# Patient Record
Sex: Male | Born: 1970 | Race: White | Hispanic: No | Marital: Married | Smoking: Never smoker
Health system: Southern US, Community
[De-identification: ages and names within clinical notes are randomized; demographics above are authoritative.]

## PROBLEM LIST (undated history)

## (undated) DIAGNOSIS — M1990 Unspecified osteoarthritis, unspecified site: Secondary | ICD-10-CM

## (undated) DIAGNOSIS — K219 Gastro-esophageal reflux disease without esophagitis: Secondary | ICD-10-CM

---

## 1998-10-27 ENCOUNTER — Encounter: Payer: Self-pay | Admitting: Family Medicine

## 1998-10-27 ENCOUNTER — Ambulatory Visit (HOSPITAL_COMMUNITY): Admission: RE | Admit: 1998-10-27 | Discharge: 1998-10-27 | Payer: Self-pay | Admitting: Family Medicine

## 2007-03-06 ENCOUNTER — Emergency Department (HOSPITAL_COMMUNITY): Admission: EM | Admit: 2007-03-06 | Discharge: 2007-03-06 | Payer: Self-pay | Admitting: Emergency Medicine

## 2008-11-02 IMAGING — CR DG LUMBAR SPINE COMPLETE 4+V
3 series · 8 of 10 positions shown · non-contrast
Comparison: NONE

CLINICAL DATA: Chronic pain. No history of trauma. Evaluate for 
arthritis.  

LUMBAR SPINE

[Series 1: view not recorded · 0.17mm/px · 3 of 4 slices shown (1 of 3)]
[im 1/4]
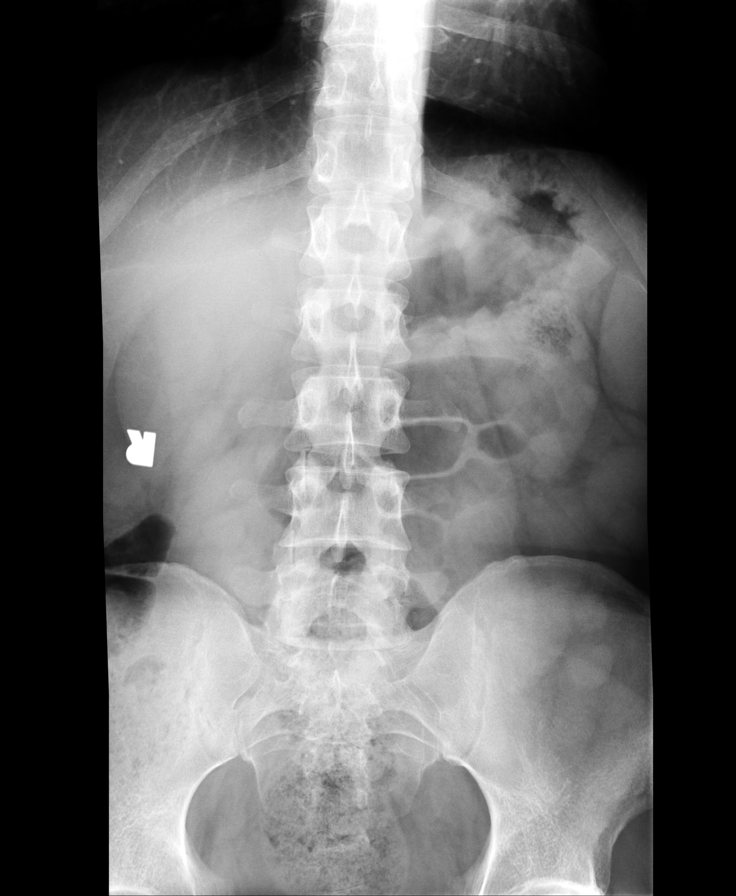
[im 2/4]
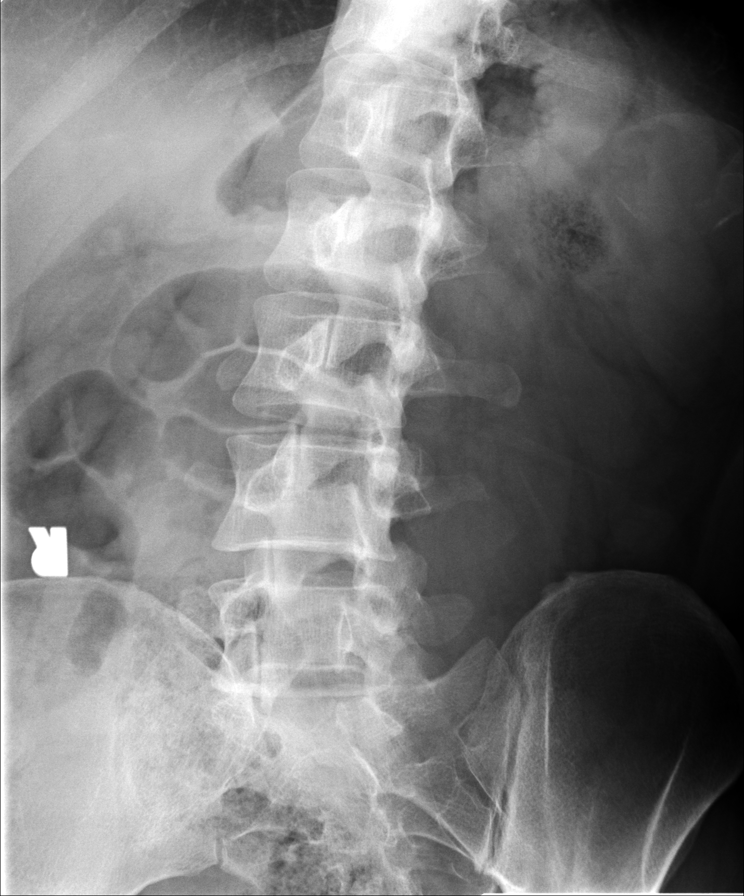
[im 4/4]
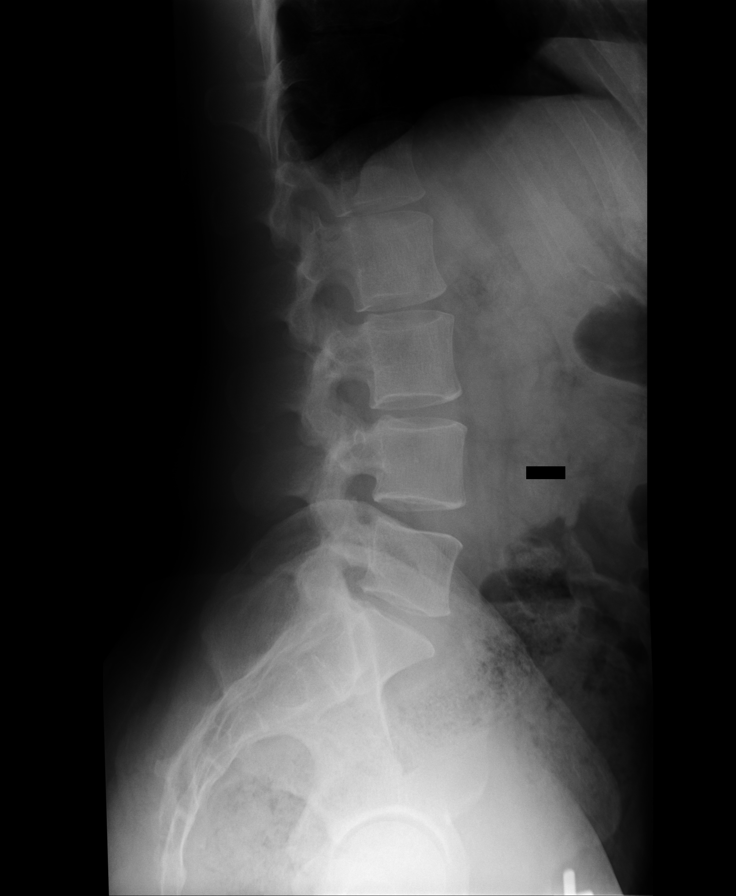

[view not recorded (2 of 3)]
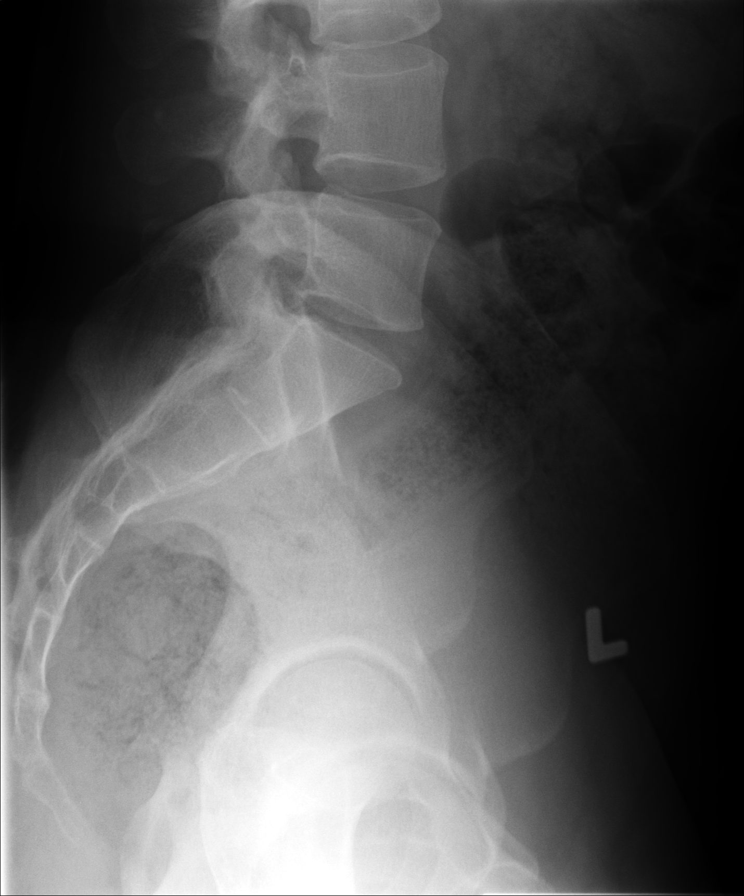

[Series 3: view not recorded · 0.17mm/px · 4 of 8 slices shown (3 of 3)]
[im 1/8]
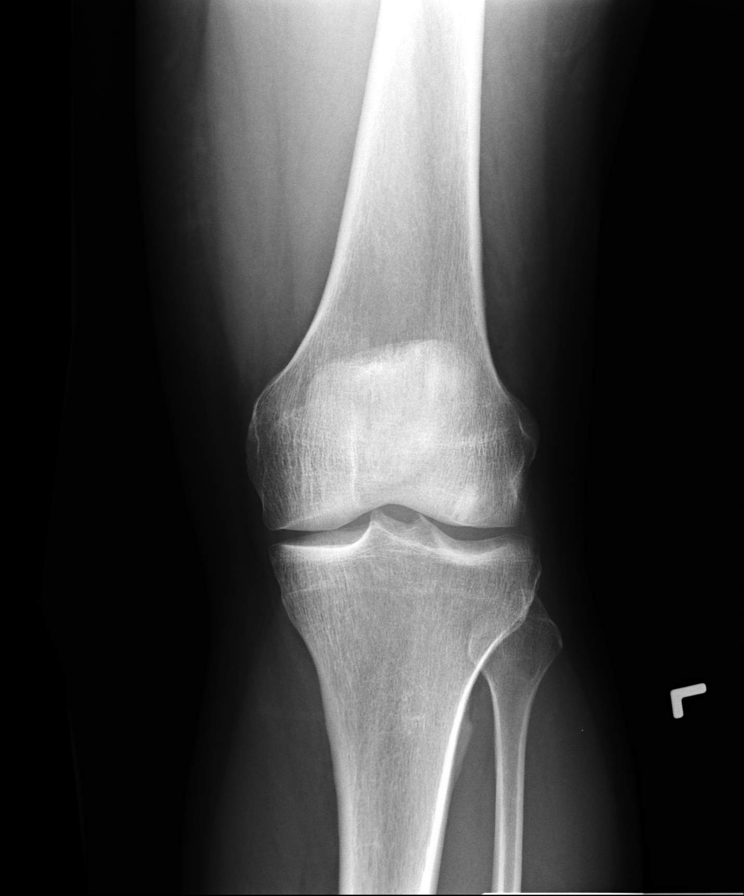
[im 2/8]
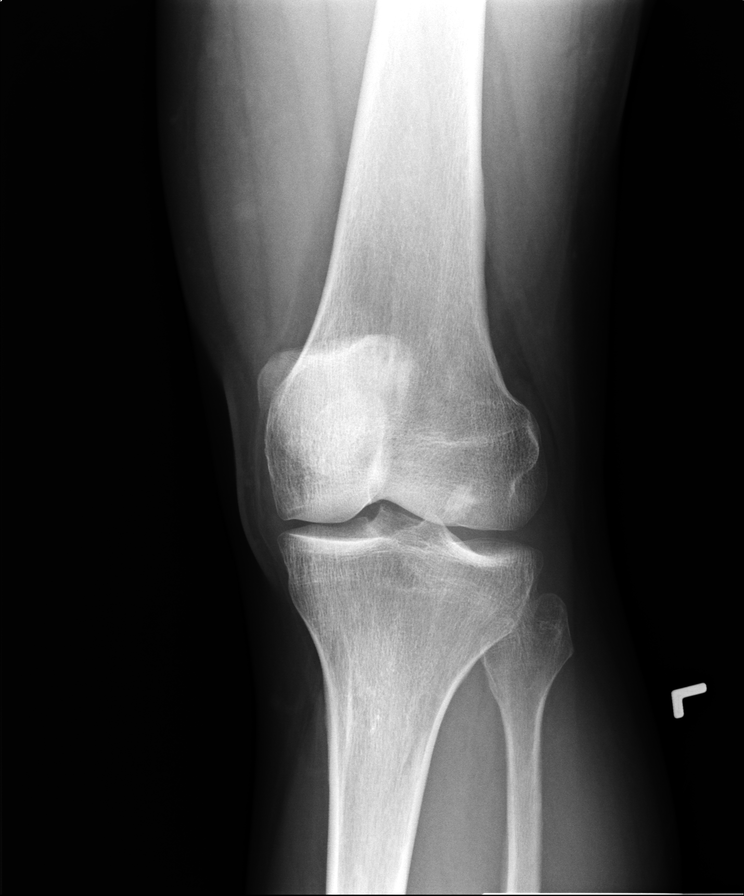
[im 3/8]
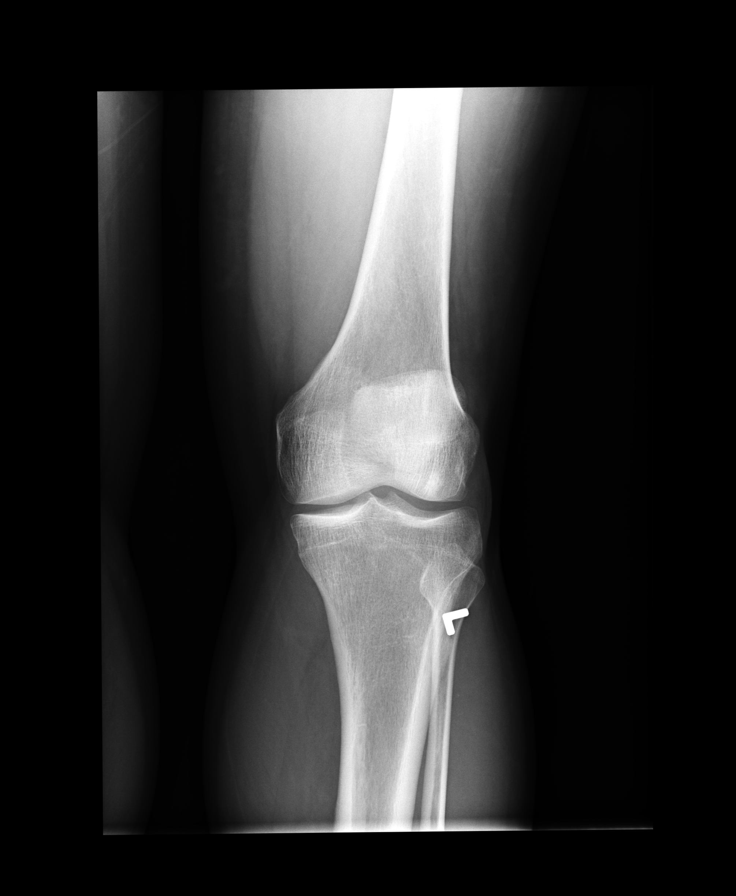
[im 5/8]
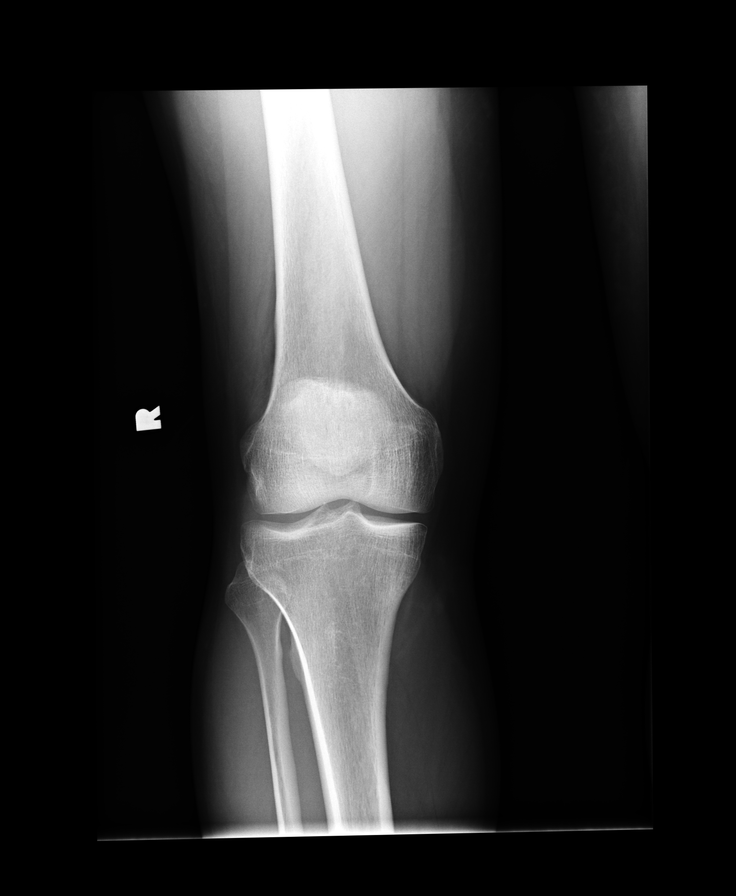

[8 of 10 positions shown; findings below may reference images not displayed]

FINDINGS: A report from 11/13/05 is submitted. There is minimal 
disc space narrowing again noted at L5-S1 with minimal marginal 
spur formation. No new findings are felt to be present. The 
vertebral bodies are intact as are the remaining disc spaces and 
posterior elements. The SI joints have a normal appearance. There 
is no evidence for ankylosing spondylitis.
IMPRESSION: Minimal degenerative change of the lower lumbar

## 2011-02-05 LAB — CBC
HCT: 45.4
Hemoglobin: 15.8
MCHC: 34.8
MCV: 89.5
Platelets: 222
RBC: 5.07
RDW: 13.1
WBC: 8.2

## 2011-02-05 LAB — URINALYSIS, ROUTINE W REFLEX MICROSCOPIC
Bilirubin Urine: NEGATIVE
Glucose, UA: NEGATIVE
Hgb urine dipstick: NEGATIVE
Ketones, ur: NEGATIVE
Nitrite: NEGATIVE
Protein, ur: NEGATIVE
Specific Gravity, Urine: 1.006
Urobilinogen, UA: 0.2
pH: 7

## 2011-02-05 LAB — COMPREHENSIVE METABOLIC PANEL
ALT: 47
AST: 40 — ABNORMAL HIGH
Albumin: 4.2
Alkaline Phosphatase: 71
BUN: 22
CO2: 27
Calcium: 9.4
Chloride: 105
Creatinine, Ser: 1.05
GFR calc Af Amer: >60
GFR calc non Af Amer: >60
Glucose, Bld: 132 — ABNORMAL HIGH
Potassium: 4
Sodium: 139
Total Bilirubin: 0.8
Total Protein: 6.8

## 2011-02-05 LAB — DIFFERENTIAL
Basophils Absolute: 0.1
Basophils Relative: 1
Eosinophils Absolute: 0.2
Eosinophils Relative: 2
Lymphocytes Relative: 20
Lymphs Abs: 1.6
Monocytes Absolute: 0.3
Monocytes Relative: 4
Neutro Abs: 6
Neutrophils Relative %: 73

## 2011-02-05 LAB — LIPASE, BLOOD: Lipase: 42

## 2011-02-05 LAB — D-DIMER, QUANTITATIVE: D-Dimer, Quant: <0.22

## 2011-08-19 ENCOUNTER — Telehealth: Payer: Self-pay | Admitting: Cardiology

## 2011-08-19 NOTE — Telephone Encounter (Signed)
Close  

## 2014-08-16 ENCOUNTER — Emergency Department (HOSPITAL_COMMUNITY): Payer: BLUE CROSS/BLUE SHIELD

## 2014-08-16 ENCOUNTER — Encounter (HOSPITAL_COMMUNITY): Payer: Self-pay | Admitting: Emergency Medicine

## 2014-08-16 ENCOUNTER — Emergency Department (HOSPITAL_COMMUNITY)
Admission: EM | Admit: 2014-08-16 | Discharge: 2014-08-16 | Disposition: A | Discharge status: Home / Self Care | Payer: BLUE CROSS/BLUE SHIELD | Attending: Emergency Medicine | Admitting: Emergency Medicine

## 2014-08-16 DIAGNOSIS — Z880 Allergy status to penicillin: Secondary | ICD-10-CM | POA: Insufficient documentation

## 2014-08-16 DIAGNOSIS — K219 Gastro-esophageal reflux disease without esophagitis: Secondary | ICD-10-CM | POA: Insufficient documentation

## 2014-08-16 DIAGNOSIS — K8020 Calculus of gallbladder without cholecystitis without obstruction: Secondary | ICD-10-CM | POA: Insufficient documentation

## 2014-08-16 DIAGNOSIS — R1013 Epigastric pain: Secondary | ICD-10-CM | POA: Diagnosis present

## 2014-08-16 DIAGNOSIS — Z79899 Other long term (current) drug therapy: Secondary | ICD-10-CM | POA: Diagnosis not present

## 2014-08-16 HISTORY — DX: Gastro-esophageal reflux disease without esophagitis: K21.9

## 2014-08-16 HISTORY — DX: Unspecified osteoarthritis, unspecified site: M19.90

## 2014-08-16 LAB — COMPREHENSIVE METABOLIC PANEL
ALT: 21 U/L (ref 0–53)
AST: 21 U/L (ref 0–37)
Albumin: 4.7 g/dL (ref 3.5–5.2)
Alkaline Phosphatase: 88 U/L (ref 39–117)
Anion gap: 10 (ref 5–15)
BUN: 15 mg/dL (ref 6–23)
CO2: 22 mmol/L (ref 19–32)
Calcium: 9 mg/dL (ref 8.4–10.5)
Chloride: 106 mmol/L (ref 96–112)
Creatinine, Ser: 1.02 mg/dL (ref 0.50–1.35)
GFR calc Af Amer: >90 mL/min (ref >90)
GFR calc non Af Amer: 87 mL/min — ABNORMAL LOW (ref >90)
Glucose, Bld: 107 mg/dL — ABNORMAL HIGH (ref 70–99)
Potassium: 3.4 mmol/L — ABNORMAL LOW (ref 3.5–5.1)
Sodium: 138 mmol/L (ref 135–145)
Total Bilirubin: 1.1 mg/dL (ref 0.3–1.2)
Total Protein: 7.8 g/dL (ref 6.0–8.3)

## 2014-08-16 LAB — CBC WITH DIFFERENTIAL/PLATELET
Basophils Absolute: 0 10*3/uL (ref 0.0–0.1)
Basophils Relative: 0 % (ref 0–1)
Eosinophils Absolute: 0.1 10*3/uL (ref 0.0–0.7)
Eosinophils Relative: 1 % (ref 0–5)
HCT: 47.1 % (ref 39.0–52.0)
Hemoglobin: 16.3 g/dL (ref 13.0–17.0)
Lymphocytes Relative: 18 % (ref 12–46)
Lymphs Abs: 2 10*3/uL (ref 0.7–4.0)
MCH: 30.8 pg (ref 26.0–34.0)
MCHC: 34.6 g/dL (ref 30.0–36.0)
MCV: 89 fL (ref 78.0–100.0)
Monocytes Absolute: 0.7 10*3/uL (ref 0.1–1.0)
Monocytes Relative: 6 % (ref 3–12)
Neutro Abs: 8.1 10*3/uL — ABNORMAL HIGH (ref 1.7–7.7)
Neutrophils Relative %: 75 % (ref 43–77)
Platelets: 279 10*3/uL (ref 150–400)
RBC: 5.29 MIL/uL (ref 4.22–5.81)
RDW: 13 % (ref 11.5–15.5)
WBC: 10.9 10*3/uL — ABNORMAL HIGH (ref 4.0–10.5)

## 2014-08-16 LAB — LIPASE, BLOOD: Lipase: 33 U/L (ref 11–59)

## 2014-08-16 MED ORDER — ONDANSETRON HCL 4 MG PO TABS: 4.0000 mg | ORAL_TABLET | Freq: Four times a day (QID) | ORAL | Status: DC

## 2014-08-16 MED ORDER — HYDROCODONE-ACETAMINOPHEN 5-325 MG PO TABS: 2.0000 | ORAL_TABLET | ORAL | Status: DC | PRN

## 2014-08-16 NOTE — ED Notes (Signed)
Pt admits to RUQ pain and intermittent nausea s/p eating - pt denies vomiting or fever, admits intermittent diarrhea. Pt denies any hx of gallstones.

## 2014-08-16 NOTE — ED Notes (Signed)
Pt reports for a week after meals he has epigastric pain. Pt reports he is also having right sided abdominal pains.

## 2014-08-16 NOTE — ED Provider Notes (Signed)
CSN: [id number]     Arrival date & time 08/16/14  0257 History   First MD Initiated Contact with Patient 08/16/14 [phone]     Chief Complaint  Patient presents with  . Abdominal Pain     (Consider location/radiation/quality/duration/timing/severity/associated sxs/prior Treatment) HPI Comments: Patient presents to the ER for evaluation of abdominal pain. Patient reports that for the last week or so he has been experiencing pain in the right upper and epigastric area of his abdomen. Initially the pain was with fatty foods, but in the last couple of days he has been expressing pain with any food intake. Patient has had nausea but he has not had any vomiting. No hematemesis, melena, rectal bleeding. He does have history of acid reflux and a hernia, but the symptoms feel different.  Patient is a 44 y.o. male presenting with abdominal pain.  Abdominal Pain Associated symptoms: nausea     Past Medical History  Diagnosis Date  . Acid reflux   . Arthritis    History reviewed. No pertinent past surgical history. History reviewed. No pertinent family history. History  Substance Use Topics  . Smoking status: Never Smoker   . Smokeless tobacco: Not on file  . Alcohol Use: No    Review of Systems  Gastrointestinal: Positive for nausea and abdominal pain.  All other systems reviewed and are negative.     Allergies  Penicillins  Home Medications   Prior to Admission medications   Medication Sig Start Date End Date Taking? Authorizing Provider  Glucosamine-Chondroitin 750 & 400 MG MISC Take 1 tablet by mouth 2 (two) times daily.   Yes Historical Provider, MD  minoxidil (ROGAINE) 2 % external solution Apply 1 mL topically 2 (two) times daily.   Yes Historical Provider, MD  Multiple Vitamin (MULTIVITAMIN WITH MINERALS) TABS tablet Take 1 tablet by mouth daily.   Yes Historical Provider, MD  Omega-3 Fatty Acids (FISH OIL) 1200 MG CAPS Take 1,200 mg by mouth daily.   Yes Historical Provider,  MD  omeprazole-sodium bicarbonate (ZEGERID) 40-1100 MG per capsule Take 1 capsule by mouth daily as needed (for acid reflux).   Yes Historical Provider, MD   BP 152/98 mmHg  Pulse 85  Temp(Src) 97.9 F (36.6 C) (Oral)  Resp 18  Ht  (1.88 m)  Wt 212 lb (96.163 kg)  BMI 27.21 kg/m2  SpO2 100% Physical Exam  Constitutional: He is oriented to person, place, and time. He appears well-developed and well-nourished. No distress.  HENT:  Head: Normocephalic and atraumatic.  Right Ear: Hearing normal.  Left Ear: Hearing normal.  Nose: Nose normal.  Mouth/Throat: Oropharynx is clear and moist and mucous membranes are normal.  Eyes: Conjunctivae and EOM are normal. Pupils are equal, round, and reactive to light.  Neck: Normal range of motion. Neck supple.  Cardiovascular: Regular rhythm, S1 normal and S2 normal.  Exam reveals no gallop and no friction rub.   No murmur heard. Pulmonary/Chest: Effort normal and breath sounds normal. No respiratory distress. He exhibits no tenderness.  Abdominal: Soft. Normal appearance and bowel sounds are normal. There is no hepatosplenomegaly. There is tenderness in the right upper quadrant. There is no rebound, no guarding, no tenderness at McBurney's point and negative Murphy's sign. No hernia.  Musculoskeletal: Normal range of motion.  Neurological: He is alert and oriented to person, place, and time. He has normal strength. No cranial nerve deficit or sensory deficit. Coordination normal. GCS eye subscore is 4. GCS verbal subscore is 5. GCS  motor subscore is 6.  Skin: Skin is warm, dry and intact. No rash noted. No cyanosis.  Psychiatric: He has a normal mood and affect. His speech is normal and behavior is normal. Thought content normal.  Nursing note and vitals reviewed.   ED Course  Procedures (including critical care time) Labs Review Labs Reviewed  CBC WITH DIFFERENTIAL/PLATELET - Abnormal; Notable for the following:    WBC 10.9 (*)    Neutro  Abs 8.1 (*)    All other components within normal limits  COMPREHENSIVE METABOLIC PANEL - Abnormal; Notable for the following:    Potassium 3.4 (*)    Glucose, Bld 107 (*)    GFR calc non Af Amer 87 (*)    All other components within normal limits  LIPASE, BLOOD    Imaging Review [place] Abdomen Limited  08/16/2014   CLINICAL DATA:  One day of midline and right-sided abdominal pain also postprandial nausea  EXAM: US ABDOMEN LIMITED - RIGHT UPPER QUADRANT  COMPARISON:  Abdominal ultrasound of March 06, 2007  FINDINGS: Gallbladder:  The gallbladder is adequately distended. There is an echogenic non mobile non shadowing focus adherent to the luminal wall. There is no gallbladder wall thickening, pericholecystic fluid, or positive sonographic Murphy's sign.  Common bile duct:  Diameter: 2.4 mm  Liver:  The liver exhibits normal echotexture with no focal mass or ductal dilation.  IMPRESSION: Non mobile 4 mm stone versus polyp within the otherwise normal-appearing gallbladder. The liver and common bile duct are normal.   Electronically Signed   By: David  Swazil[place]   On: 08/16/2014 07:37     EKG Interpretation None      MDM   Final diagnoses:  None   cholelithiasis  This presents to the ER for evaluation of progressively worsening right upper abdominal pain. Initially pain was with eating fatty foods, now experiencing pain with any intake of food. Patient did have moderate right upper quadrant tenderness on examination. Lab work including LFTs are normal. Ultrasound performed does reveal evidence of a gallstone, no evidence of cholecystitis. Patient is comfortable currently. Patient to be discharged, follow-up with general surgery.    Gilda Creasehristopher J Clayborne Divis, MD 08/16/14 [phone]

## 2014-08-16 NOTE — Discharge Instructions (Signed)

## 2015-08-22 DIAGNOSIS — Z1283 Encounter for screening for malignant neoplasm of skin: Secondary | ICD-10-CM | POA: Diagnosis not present

## 2015-08-22 DIAGNOSIS — Z85820 Personal history of malignant melanoma of skin: Secondary | ICD-10-CM | POA: Diagnosis not present

## 2015-08-22 DIAGNOSIS — L821 Other seborrheic keratosis: Secondary | ICD-10-CM | POA: Diagnosis not present

## 2015-08-22 DIAGNOSIS — Z08 Encounter for follow-up examination after completed treatment for malignant neoplasm: Secondary | ICD-10-CM | POA: Diagnosis not present

## 2015-09-01 DIAGNOSIS — K219 Gastro-esophageal reflux disease without esophagitis: Secondary | ICD-10-CM | POA: Diagnosis not present

## 2015-11-21 DIAGNOSIS — Z08 Encounter for follow-up examination after completed treatment for malignant neoplasm: Secondary | ICD-10-CM | POA: Diagnosis not present

## 2015-11-21 DIAGNOSIS — Z85820 Personal history of malignant melanoma of skin: Secondary | ICD-10-CM | POA: Diagnosis not present

## 2015-11-21 DIAGNOSIS — L821 Other seborrheic keratosis: Secondary | ICD-10-CM | POA: Diagnosis not present

## 2015-11-21 DIAGNOSIS — Z1283 Encounter for screening for malignant neoplasm of skin: Secondary | ICD-10-CM | POA: Diagnosis not present

## 2016-02-21 DIAGNOSIS — D225 Melanocytic nevi of trunk: Secondary | ICD-10-CM | POA: Diagnosis not present

## 2016-02-21 DIAGNOSIS — Z85820 Personal history of malignant melanoma of skin: Secondary | ICD-10-CM | POA: Diagnosis not present

## 2016-02-21 DIAGNOSIS — Z1283 Encounter for screening for malignant neoplasm of skin: Secondary | ICD-10-CM | POA: Diagnosis not present

## 2016-02-21 DIAGNOSIS — Z08 Encounter for follow-up examination after completed treatment for malignant neoplasm: Secondary | ICD-10-CM | POA: Diagnosis not present

## 2016-02-21 DIAGNOSIS — D485 Neoplasm of uncertain behavior of skin: Secondary | ICD-10-CM | POA: Diagnosis not present

## 2016-04-08 DIAGNOSIS — Z23 Encounter for immunization: Secondary | ICD-10-CM | POA: Diagnosis not present

## 2016-04-08 DIAGNOSIS — Z0000 Encounter for general adult medical examination without abnormal findings: Secondary | ICD-10-CM | POA: Diagnosis not present

## 2016-04-08 DIAGNOSIS — R030 Elevated blood-pressure reading, without diagnosis of hypertension: Secondary | ICD-10-CM | POA: Diagnosis not present

## 2016-04-08 DIAGNOSIS — R7309 Other abnormal glucose: Secondary | ICD-10-CM | POA: Diagnosis not present

## 2016-04-08 DIAGNOSIS — Z8349 Family history of other endocrine, nutritional and metabolic diseases: Secondary | ICD-10-CM | POA: Diagnosis not present

## 2016-05-22 DIAGNOSIS — Z85820 Personal history of malignant melanoma of skin: Secondary | ICD-10-CM | POA: Diagnosis not present

## 2016-05-22 DIAGNOSIS — Z1283 Encounter for screening for malignant neoplasm of skin: Secondary | ICD-10-CM | POA: Diagnosis not present

## 2016-05-22 DIAGNOSIS — Z08 Encounter for follow-up examination after completed treatment for malignant neoplasm: Secondary | ICD-10-CM | POA: Diagnosis not present

## 2016-05-22 DIAGNOSIS — D225 Melanocytic nevi of trunk: Secondary | ICD-10-CM | POA: Diagnosis not present

## 2017-04-09 DIAGNOSIS — Z0000 Encounter for general adult medical examination without abnormal findings: Secondary | ICD-10-CM | POA: Diagnosis not present

## 2017-04-09 DIAGNOSIS — R7303 Prediabetes: Secondary | ICD-10-CM | POA: Diagnosis not present

## 2017-04-09 DIAGNOSIS — M459 Ankylosing spondylitis of unspecified sites in spine: Secondary | ICD-10-CM | POA: Diagnosis not present

## 2017-04-09 DIAGNOSIS — R030 Elevated blood-pressure reading, without diagnosis of hypertension: Secondary | ICD-10-CM | POA: Diagnosis not present

## 2017-04-09 DIAGNOSIS — E7800 Pure hypercholesterolemia, unspecified: Secondary | ICD-10-CM | POA: Diagnosis not present

## 2017-04-09 DIAGNOSIS — Z125 Encounter for screening for malignant neoplasm of prostate: Secondary | ICD-10-CM | POA: Diagnosis not present

## 2017-05-12 DIAGNOSIS — L02229 Furuncle of trunk, unspecified: Secondary | ICD-10-CM | POA: Diagnosis not present

## 2017-05-12 DIAGNOSIS — Z1283 Encounter for screening for malignant neoplasm of skin: Secondary | ICD-10-CM | POA: Diagnosis not present

## 2017-05-12 DIAGNOSIS — Z85820 Personal history of malignant melanoma of skin: Secondary | ICD-10-CM | POA: Diagnosis not present

## 2017-05-12 DIAGNOSIS — Z08 Encounter for follow-up examination after completed treatment for malignant neoplasm: Secondary | ICD-10-CM | POA: Diagnosis not present

## 2017-07-21 DIAGNOSIS — R300 Dysuria: Secondary | ICD-10-CM | POA: Diagnosis not present

## 2017-08-18 DIAGNOSIS — R102 Pelvic and perineal pain: Secondary | ICD-10-CM | POA: Diagnosis not present

## 2017-11-10 DIAGNOSIS — Z85820 Personal history of malignant melanoma of skin: Secondary | ICD-10-CM | POA: Diagnosis not present

## 2017-11-10 DIAGNOSIS — L821 Other seborrheic keratosis: Secondary | ICD-10-CM | POA: Diagnosis not present

## 2017-11-10 DIAGNOSIS — Z1283 Encounter for screening for malignant neoplasm of skin: Secondary | ICD-10-CM | POA: Diagnosis not present

## 2017-11-10 DIAGNOSIS — Z08 Encounter for follow-up examination after completed treatment for malignant neoplasm: Secondary | ICD-10-CM | POA: Diagnosis not present

## 2018-04-13 DIAGNOSIS — R7303 Prediabetes: Secondary | ICD-10-CM | POA: Diagnosis not present

## 2018-04-13 DIAGNOSIS — M459 Ankylosing spondylitis of unspecified sites in spine: Secondary | ICD-10-CM | POA: Diagnosis not present

## 2018-04-13 DIAGNOSIS — Z0000 Encounter for general adult medical examination without abnormal findings: Secondary | ICD-10-CM | POA: Diagnosis not present

## 2018-04-13 DIAGNOSIS — Z125 Encounter for screening for malignant neoplasm of prostate: Secondary | ICD-10-CM | POA: Diagnosis not present

## 2018-04-13 DIAGNOSIS — E7800 Pure hypercholesterolemia, unspecified: Secondary | ICD-10-CM | POA: Diagnosis not present

## 2018-05-11 DIAGNOSIS — Z08 Encounter for follow-up examination after completed treatment for malignant neoplasm: Secondary | ICD-10-CM | POA: Diagnosis not present

## 2018-05-11 DIAGNOSIS — D485 Neoplasm of uncertain behavior of skin: Secondary | ICD-10-CM | POA: Diagnosis not present

## 2018-05-11 DIAGNOSIS — Z1283 Encounter for screening for malignant neoplasm of skin: Secondary | ICD-10-CM | POA: Diagnosis not present

## 2018-05-11 DIAGNOSIS — L821 Other seborrheic keratosis: Secondary | ICD-10-CM | POA: Diagnosis not present

## 2018-05-11 DIAGNOSIS — D225 Melanocytic nevi of trunk: Secondary | ICD-10-CM | POA: Diagnosis not present

## 2018-05-11 DIAGNOSIS — D2261 Melanocytic nevi of right upper limb, including shoulder: Secondary | ICD-10-CM | POA: Diagnosis not present

## 2018-05-11 DIAGNOSIS — Z85820 Personal history of malignant melanoma of skin: Secondary | ICD-10-CM | POA: Diagnosis not present

## 2018-05-17 DIAGNOSIS — J029 Acute pharyngitis, unspecified: Secondary | ICD-10-CM | POA: Diagnosis not present

## 2018-05-17 DIAGNOSIS — R030 Elevated blood-pressure reading, without diagnosis of hypertension: Secondary | ICD-10-CM | POA: Diagnosis not present

## 2018-11-09 DIAGNOSIS — Z08 Encounter for follow-up examination after completed treatment for malignant neoplasm: Secondary | ICD-10-CM | POA: Diagnosis not present

## 2018-11-09 DIAGNOSIS — L821 Other seborrheic keratosis: Secondary | ICD-10-CM | POA: Diagnosis not present

## 2018-11-09 DIAGNOSIS — Z1283 Encounter for screening for malignant neoplasm of skin: Secondary | ICD-10-CM | POA: Diagnosis not present

## 2018-11-09 DIAGNOSIS — Z85820 Personal history of malignant melanoma of skin: Secondary | ICD-10-CM | POA: Diagnosis not present

## 2019-01-12 DIAGNOSIS — K625 Hemorrhage of anus and rectum: Secondary | ICD-10-CM | POA: Diagnosis not present

## 2019-01-12 DIAGNOSIS — Z23 Encounter for immunization: Secondary | ICD-10-CM | POA: Diagnosis not present

## 2019-01-22 DIAGNOSIS — K602 Anal fissure, unspecified: Secondary | ICD-10-CM | POA: Diagnosis not present

## 2019-01-22 DIAGNOSIS — K625 Hemorrhage of anus and rectum: Secondary | ICD-10-CM | POA: Diagnosis not present

## 2019-01-22 DIAGNOSIS — K219 Gastro-esophageal reflux disease without esophagitis: Secondary | ICD-10-CM | POA: Diagnosis not present

## 2019-01-26 DIAGNOSIS — R195 Other fecal abnormalities: Secondary | ICD-10-CM | POA: Diagnosis not present

## 2019-02-16 DIAGNOSIS — Z1159 Encounter for screening for other viral diseases: Secondary | ICD-10-CM | POA: Diagnosis not present

## 2019-02-19 DIAGNOSIS — D122 Benign neoplasm of ascending colon: Secondary | ICD-10-CM | POA: Diagnosis not present

## 2019-02-19 DIAGNOSIS — K625 Hemorrhage of anus and rectum: Secondary | ICD-10-CM | POA: Diagnosis not present

## 2019-02-19 DIAGNOSIS — K2930 Chronic superficial gastritis without bleeding: Secondary | ICD-10-CM | POA: Diagnosis not present

## 2019-02-19 DIAGNOSIS — K3189 Other diseases of stomach and duodenum: Secondary | ICD-10-CM | POA: Diagnosis not present

## 2019-02-19 DIAGNOSIS — K648 Other hemorrhoids: Secondary | ICD-10-CM | POA: Diagnosis not present

## 2019-02-21 ENCOUNTER — Emergency Department (HOSPITAL_COMMUNITY): Payer: BC Managed Care – PPO

## 2019-02-21 ENCOUNTER — Emergency Department (HOSPITAL_COMMUNITY)
Admission: EM | Admit: 2019-02-21 | Discharge: 2019-02-21 | Disposition: A | Discharge status: Home / Self Care | Payer: BC Managed Care – PPO | Attending: Emergency Medicine | Admitting: Emergency Medicine

## 2019-02-21 ENCOUNTER — Other Ambulatory Visit: Payer: Self-pay

## 2019-02-21 ENCOUNTER — Encounter (HOSPITAL_COMMUNITY): Payer: Self-pay

## 2019-02-21 DIAGNOSIS — K529 Noninfective gastroenteritis and colitis, unspecified: Secondary | ICD-10-CM | POA: Diagnosis not present

## 2019-02-21 DIAGNOSIS — R109 Unspecified abdominal pain: Secondary | ICD-10-CM | POA: Diagnosis not present

## 2019-02-21 DIAGNOSIS — G8918 Other acute postprocedural pain: Secondary | ICD-10-CM

## 2019-02-21 DIAGNOSIS — Z79899 Other long term (current) drug therapy: Secondary | ICD-10-CM | POA: Insufficient documentation

## 2019-02-21 LAB — URINALYSIS, ROUTINE W REFLEX MICROSCOPIC
Bilirubin Urine: NEGATIVE
Glucose, UA: NEGATIVE mg/dL
Hgb urine dipstick: NEGATIVE
Ketones, ur: NEGATIVE mg/dL
Leukocytes,Ua: NEGATIVE
Nitrite: NEGATIVE
Protein, ur: NEGATIVE mg/dL
Specific Gravity, Urine: 1.016 (ref 1.005–1.030)
pH: 6 (ref 5.0–8.0)

## 2019-02-21 LAB — CBC
HCT: 49.2 % (ref 39.0–52.0)
Hemoglobin: 16.2 g/dL (ref 13.0–17.0)
MCH: 30.7 pg (ref 26.0–34.0)
MCHC: 32.9 g/dL (ref 30.0–36.0)
MCV: 93.4 fL (ref 80.0–100.0)
Platelets: 249 10*3/uL (ref 150–400)
RBC: 5.27 MIL/uL (ref 4.22–5.81)
RDW: 12.5 % (ref 11.5–15.5)
WBC: 15.3 10*3/uL — ABNORMAL HIGH (ref 4.0–10.5)
nRBC: 0 % (ref 0.0–0.2)

## 2019-02-21 LAB — COMPREHENSIVE METABOLIC PANEL
ALT: 38 U/L (ref 0–44)
AST: 32 U/L (ref 15–41)
Albumin: 4.4 g/dL (ref 3.5–5.0)
Alkaline Phosphatase: 95 U/L (ref 38–126)
Anion gap: 8 (ref 5–15)
BUN: 14 mg/dL (ref 6–20)
CO2: 24 mmol/L (ref 22–32)
Calcium: 9 mg/dL (ref 8.9–10.3)
Chloride: 105 mmol/L (ref 98–111)
Creatinine, Ser: 1.24 mg/dL (ref 0.61–1.24)
GFR calc Af Amer: >60 mL/min (ref >60)
GFR calc non Af Amer: >60 mL/min (ref >60)
Glucose, Bld: 122 mg/dL — ABNORMAL HIGH (ref 70–99)
Potassium: 4.2 mmol/L (ref 3.5–5.1)
Sodium: 137 mmol/L (ref 135–145)
Total Bilirubin: 0.7 mg/dL (ref 0.3–1.2)
Total Protein: 7.2 g/dL (ref 6.5–8.1)

## 2019-02-21 LAB — LIPASE, BLOOD: Lipase: 36 U/L (ref 11–51)

## 2019-02-21 LAB — I-STAT CHEM 8, ED
BUN: 14 mg/dL (ref 6–20)
Calcium, Ion: 1.18 mmol/L (ref 1.15–1.40)
Chloride: 101 mmol/L (ref 98–111)
Creatinine, Ser: 1.1 mg/dL (ref 0.61–1.24)
Glucose, Bld: 117 mg/dL — ABNORMAL HIGH (ref 70–99)
HCT: 49 % (ref 39.0–52.0)
Hemoglobin: 16.7 g/dL (ref 13.0–17.0)
Potassium: 4.3 mmol/L (ref 3.5–5.1)
Sodium: 140 mmol/L (ref 135–145)
TCO2: 24 mmol/L (ref 22–32)

## 2019-02-21 MED ORDER — DICYCLOMINE HCL 10 MG/ML IM SOLN
20.0000 mg | Freq: Once | INTRAMUSCULAR | Status: AC
  Administered 2019-02-21: 20 mg via INTRAMUSCULAR
  Filled 2019-02-21: qty 2

## 2019-02-21 MED ORDER — METRONIDAZOLE 500 MG PO TABS
500.0000 mg | ORAL_TABLET | Freq: Once | ORAL | Status: AC
  Administered 2019-02-21: 500 mg via ORAL
  Filled 2019-02-21: qty 1

## 2019-02-21 MED ORDER — METRONIDAZOLE 500 MG PO TABS: 500.0000 mg | ORAL_TABLET | Freq: Three times a day (TID) | ORAL | 0 refills | Status: DC

## 2019-02-21 MED ORDER — NAPROXEN 375 MG PO TABS: 375.0000 mg | ORAL_TABLET | Freq: Two times a day (BID) | ORAL | 0 refills | Status: AC

## 2019-02-21 MED ORDER — SODIUM CHLORIDE 0.9% FLUSH
3.0000 mL | Freq: Once | INTRAVENOUS | Status: AC
  Administered 2019-02-21: 03:00:00 3 mL via INTRAVENOUS

## 2019-02-21 MED ORDER — SODIUM CHLORIDE 0.9 % IV BOLUS
500.0000 mL | Freq: Once | INTRAVENOUS | Status: AC
  Administered 2019-02-21: 500 mL via INTRAVENOUS

## 2019-02-21 MED ORDER — KETOROLAC TROMETHAMINE 30 MG/ML IJ SOLN
15.0000 mg | Freq: Once | INTRAMUSCULAR | Status: AC
  Administered 2019-02-21: 15 mg via INTRAVENOUS
  Filled 2019-02-21: qty 1

## 2019-02-21 MED ORDER — IOHEXOL 300 MG/ML  SOLN
100.0000 mL | Freq: Once | INTRAMUSCULAR | Status: AC | PRN
  Administered 2019-02-21: 100 mL via INTRAVENOUS

## 2019-02-21 MED ORDER — SODIUM CHLORIDE (PF) 0.9 % IJ SOLN
INTRAMUSCULAR | Status: AC
  Filled 2019-02-21: qty 50

## 2019-02-21 MED ORDER — TRAMADOL HCL 50 MG PO TABS
50.0000 mg | ORAL_TABLET | Freq: Once | ORAL | Status: AC
  Administered 2019-02-21: 50 mg via ORAL
  Filled 2019-02-21: qty 1

## 2019-02-21 MED ORDER — ALUM & MAG HYDROXIDE-SIMETH 200-200-20 MG/5ML PO SUSP
30.0000 mL | Freq: Once | ORAL | Status: AC
Start: 2019-02-21 — End: 2019-02-21
  Administered 2019-02-21: 30 mL via ORAL
  Filled 2019-02-21: qty 30

## 2019-02-21 MED ORDER — CIPROFLOXACIN HCL 500 MG PO TABS
500.0000 mg | ORAL_TABLET | Freq: Once | ORAL | Status: AC
  Administered 2019-02-21: 500 mg via ORAL
  Filled 2019-02-21: qty 1

## 2019-02-21 MED ORDER — FENTANYL CITRATE (PF) 100 MCG/2ML IJ SOLN
100.0000 ug | Freq: Once | INTRAMUSCULAR | Status: AC
  Administered 2019-02-21: 100 ug via INTRAVENOUS
  Filled 2019-02-21: qty 2

## 2019-02-21 MED ORDER — ONDANSETRON HCL 4 MG/2ML IJ SOLN
4.0000 mg | Freq: Once | INTRAMUSCULAR | Status: AC
  Administered 2019-02-21: 4 mg via INTRAVENOUS
  Filled 2019-02-21: qty 2

## 2019-02-21 MED ORDER — CIPROFLOXACIN HCL 500 MG PO TABS: 500.0000 mg | ORAL_TABLET | Freq: Two times a day (BID) | ORAL | 0 refills | Status: DC

## 2019-02-21 NOTE — ED Notes (Signed)
[address] [phone] friend who is coming to pick up patient

## 2019-02-21 NOTE — ED Notes (Signed)
Requested urine from patient. 

## 2019-02-21 NOTE — ED Provider Notes (Signed)
[facility]-EMERGENCY DEPT Provider Note   CSN: [id number] Arrival date & time: 02/21/19  0142     History   Chief Complaint Chief Complaint  Patient presents with  . Abdominal Pain    HPI Edward Ross is a 48 y.o. male.     The history is provided by the patient.  Abdominal Cramping This is a new problem. The current episode started 12 to 24 hours ago. The problem occurs constantly. The problem has not changed since onset.Associated symptoms include abdominal pain. Pertinent negatives include no chest pain, no headaches and no shortness of breath. Nothing aggravates the symptoms. Nothing relieves the symptoms. He has tried nothing for the symptoms. The treatment provided no relief.  Patient had a colonoscopy and endoscopy on Friday with polyp removal and has had pain and bloating and gas pain since.  No f/c/r.  No n/v/d.    Past Medical History:  Diagnosis Date  . Acid reflux   . Arthritis     There are no active problems to display for this patient.   History reviewed. No pertinent surgical history.      Home Medications    Prior to Admission medications   Medication Sig Start Date End Date Taking? Authorizing Provider  Glucosamine-Chondroitin 750 & 400 MG MISC Take 1 tablet by mouth 2 (two) times daily.   Yes [provider]  minoxidil (ROGAINE) 2 % external solution Apply 1 mL topically 2 (two) times daily.   Yes [provider]  Multiple Vitamin (MULTIVITAMIN WITH MINERALS) TABS tablet Take 1 tablet by mouth daily.   Yes [provider]  Omega-3 Fatty Acids (FISH OIL) 1200 MG CAPS Take 1,200 mg by mouth daily.   Yes [provider]  omeprazole-sodium bicarbonate (ZEGERID) 40-1100 MG per capsule Take 1 capsule by mouth daily as needed (for acid reflux).   Yes [provider]  HYDROcodone-acetaminophen (NORCO/VICODIN) 5-325 MG per tablet Take 2 tablets by mouth every 4 (four) hours as needed for  moderate pain. Patient not taking: Reported on 02/21/2019 08/16/14   Gilda Crease, MD  ondansetron (ZOFRAN) 4 MG tablet Take 1 tablet (4 mg total) by mouth every 6 (six) hours. Patient not taking: Reported on 02/21/2019 08/16/14   Gilda Crease, MD    Family History History reviewed. No pertinent family history.  Social History Social History   Tobacco Use  . Smoking status: Never Smoker  Substance Use Topics  . Alcohol use: No  . Drug use: No     Allergies   Penicillins   Review of Systems Review of Systems  Constitutional: Negative for fever.  HENT: Negative for congestion.   Eyes: Negative for visual disturbance.  Respiratory: Negative for shortness of breath.   Cardiovascular: Negative for chest pain.  Gastrointestinal: Positive for abdominal pain.  Genitourinary: Negative for difficulty urinating.  Musculoskeletal: Negative for arthralgias.  Neurological: Negative for headaches.  Psychiatric/Behavioral: Negative for agitation.  All other systems reviewed and are negative.    Physical Exam Updated Vital Signs BP (!) 155/80 (BP Location: Left Arm)   Pulse (!) 113   Temp 98.7 F (37.1 C) (Oral)   Resp 18   Ht  (1.854 m)   Wt 90.7 kg   SpO2 100%   BMI 26.39 kg/m   Physical Exam Vitals signs and nursing note reviewed.  Constitutional:      General: He is not in acute distress.    Appearance: He is normal weight.  HENT:  Head: Normocephalic and atraumatic.     Nose: Nose normal.  Eyes:     Conjunctiva/sclera: Conjunctivae normal.     Pupils: Pupils are equal, round, and reactive to light.  Neck:     Musculoskeletal: Normal range of motion and neck supple.  Cardiovascular:     Rate and Rhythm: Normal rate and regular rhythm.     Pulses: Normal pulses.     Heart sounds: Normal heart sounds.  Pulmonary:     Effort: Pulmonary effort is normal.     Breath sounds: Normal breath sounds.  Abdominal:     General: Abdomen is  flat. Bowel sounds are normal.     Tenderness: There is no abdominal tenderness. There is no guarding or rebound. Negative signs include Murphy's sign and McBurney's sign.     Hernia: No hernia is present.  Musculoskeletal: Normal range of motion.  Skin:    General: Skin is warm and dry.     Capillary Refill: Capillary refill takes less than 2 seconds.  Neurological:     General: No focal deficit present.     Mental Status: He is alert and oriented to person, place, and time.  Psychiatric:        Mood and Affect: Mood normal.        Behavior: Behavior normal.      ED Treatments / Results  Labs (all labs ordered are listed, but only abnormal results are displayed) Results for orders placed or performed during the hospital encounter of 02/21/19  Lipase, blood  Result Value Ref Range   Lipase 36 11 - 51 U/L  Comprehensive metabolic panel  Result Value Ref Range   Sodium 137 135 - 145 mmol/L   Potassium 4.2 3.5 - 5.1 mmol/L   Chloride 105 98 - 111 mmol/L   CO2 24 22 - 32 mmol/L   Glucose, Bld 122 (H) 70 - 99 mg/dL   BUN 14 6 - 20 mg/dL   Creatinine, Ser 7.32 0.61 - 1.24 mg/dL   Calcium 9.0 8.9 - 20.2 mg/dL   Total Protein 7.2 6.5 - 8.1 g/dL   Albumin 4.4 3.5 - 5.0 g/dL   AST 32 15 - 41 U/L   ALT 38 0 - 44 U/L   Alkaline Phosphatase 95 38 - 126 U/L   Total Bilirubin 0.7 0.3 - 1.2 mg/dL   GFR calc non Af Amer >60 >60 mL/min   GFR calc Af Amer >60 >60 mL/min   Anion gap 8 5 - 15  CBC  Result Value Ref Range   WBC 15.3 (H) 4.0 - 10.5 K/uL   RBC 5.27 4.22 - 5.81 MIL/uL   Hemoglobin 16.2 13.0 - 17.0 g/dL   HCT 54.2 70.6 - 23.7 %   MCV 93.4 80.0 - 100.0 fL   MCH 30.7 26.0 - 34.0 pg   MCHC 32.9 30.0 - 36.0 g/dL   RDW 62.8 31.5 - 17.6 %   Platelets 249 150 - 400 K/uL   nRBC 0.0 0.0 - 0.2 %  Urinalysis, Routine w reflex microscopic  Result Value Ref Range   Color, Urine YELLOW YELLOW   APPearance CLEAR CLEAR   Specific Gravity, Urine 1.016 1.005 - 1.030   pH 6.0 5.0 -  8.0   Glucose, UA NEGATIVE NEGATIVE mg/dL   Hgb urine dipstick NEGATIVE NEGATIVE   Bilirubin Urine NEGATIVE NEGATIVE   Ketones, ur NEGATIVE NEGATIVE mg/dL   Protein, ur NEGATIVE NEGATIVE mg/dL   Nitrite NEGATIVE NEGATIVE   Leukocytes,Ua NEGATIVE  NEGATIVE  I-stat chem 8, ED (not at [facility] or [facility])  Result Value Ref Range   Sodium 140 135 - 145 mmol/L   Potassium 4.3 3.5 - 5.1 mmol/L   Chloride 101 98 - 111 mmol/L   BUN 14 6 - 20 mg/dL   Creatinine, Ser 1.611.10 0.61 - 1.24 mg/dL   Glucose, Bld 096117 (H) 70 - 99 mg/dL   Calcium, Ion 0.451.18 4.091.15 - 1.40 mmol/L   TCO2 24 22 - 32 mmol/L   Hemoglobin 16.7 13.0 - 17.0 g/dL   HCT 81.149.0 91.439.0 - 78.252.0 %   Ct Abdomen Pelvis W Contrast  Result Date: 02/21/2019 CLINICAL DATA:  Initial evaluation for acute left lower quadrant abdominal pain, recent colonoscopy with polypectomy. EXAM: CT ABDOMEN AND PELVIS WITH CONTRAST TECHNIQUE: Multidetector CT imaging of the abdomen and pelvis was performed using the standard protocol following bolus administration of intravenous contrast. CONTRAST:  100mL OMNIPAQUE IOHEXOL 300 MG/ML  SOLN COMPARISON:  Prior ultrasound from 03/07/2007. FINDINGS: Lower chest: Mild linear atelectatic changes present within the left lower lobe. Visualized lungs are otherwise clear. Hepatobiliary: Liver demonstrates a normal contrast enhanced appearance. Gallbladder surgically absent. No biliary dilatation. Pancreas: Pancreas within normal limits. Spleen: Spleen intact and normal. Adrenals/Urinary Tract: Renal glands within normal limits. Kidneys equal in size with symmetric enhancement. Approximate 1 cm simple cyst present at the upper pole of the right kidney. No nephrolithiasis, hydronephrosis, or focal enhancing renal mass. No hydroureter. Partially distended bladder within normal limits. Stomach/Bowel: Small hiatal hernia noted. Stomach otherwise unremarkable. Duodenum within normal limits. No evidence for small bowel obstruction. There is mild  mucosal enhancement with hazy stranding about the terminal ileum within the right lower quadrant, suspicious for possible acute enteritis (series 2, image 65). This could be either infectious or inflammatory nature. Cecum positioned somewhat midline in the mid pelvis. Appendix is not definitely visualized, however, no secondary signs to suggest acute appendicitis identified. Colon largely decompressed without acute inflammatory changes or other abnormality. No complication related to recent colonoscopy identified. Vascular/Lymphatic: Normal intravascular enhancement seen throughout the intra-abdominal aorta. Mesenteric vessels patent proximally. No adenopathy. Reproductive: Prostate within normal limits. Prominent venous collaterals noted about the prostate. Other: No free air or fluid. Mild within the right aspect of the omentum within the right lower quadrant, suspected to be reactive related to the adjacent enteritis. Vascular congestion noted Musculoskeletal: No acute osseous abnormality. No discrete lytic or blastic osseous lesions. IMPRESSION: 1. Mild mucosal enhancement with hazy stranding about the terminal ileum within the right lower quadrant, suspicious for possible acute enteritis. This could be either infectious or inflammatory in nature. 2. No other acute intra-abdominal or pelvic process identified. No complication related to recent colonoscopy identified. 3. Status post cholecystectomy. Electronically Signed   By: Rise MuBenjamin  McClintock M.D.   On: 02/21/2019 04:27    None  Radiology No results found.  Procedures Procedures (including critical care time)  Medications Ordered in ED Medications  dicyclomine (BENTYL) injection 20 mg (has no administration in time range)  alum & mag hydroxide-simeth (MAALOX/MYLANTA) 200-200-20 MG/5ML suspension 30 mL (has no administration in time range)  ketorolac (TORADOL) 30 MG/ML injection 15 mg (has no administration in time range)  ciprofloxacin  (CIPRO) tablet 500 mg (has no administration in time range)  metroNIDAZOLE (FLAGYL) tablet 500 mg (has no administration in time range)  sodium chloride flush (NS) 0.9 % injection 3 mL (3 mLs Intravenous Given 02/21/19 0248)  fentaNYL (SUBLIMAZE) injection 100 mcg (100 mcg Intravenous Given 02/21/19  3235)  ondansetron [facility]) injection 4 mg (4 mg Intravenous Given 02/21/19 0311)  iohexol (OMNIPAQUE) 300 MG/ML solution 100 mL (100 mLs Intravenous Contrast Given 02/21/19 0322)    Case d/w Dr. Alessandra Bevels  Of [facility], can happen post polyp snare.  State cipro flagyl for 10 days.    Doing well post medication, stable for discharge.  I do not believe the patient has appendicitis.  But have given strict abdominal pain return precautions.     Initial Impression / Assessment and Plan / ED Course   Lacorey Brusca was evaluated in Emergency Department on 02/21/2019 for the symptoms described in the history of present illness. He was evaluated in the context of the global COVID-19 pandemic, which necessitated consideration that the patient might be at risk for infection with the SARS-CoV-2 virus that causes COVID-19. Institutional protocols and algorithms that pertain to the evaluation of patients at risk for COVID-19 are in a state of rapid change based on information released by regulatory bodies including the CDC and federal and state organizations. These policies and algorithms were followed during the patient's care in the ED.  Final Clinical Impressions(s) / ED Diagnoses   Return for weakness, numbness, changes in vision or speech, fevers >100.4 unrelieved by medication, shortness of breath, intractable vomiting, or diarrhea, abdominal pain, Inability to tolerate liquids or food, cough, altered mental status or any concerns. No signs of systemic illness or infection. The patient is nontoxic-appearing on exam and vital signs are within normal limits.   I have reviewed the triage vital signs and  the nursing notes. Pertinent labs &imaging results that were available during my care of the patient were reviewed by me and considered in my medical decision making (see chart for details).  After history, exam, and medical workup I feel the patient has been appropriately medically screened and is safe for discharge home. Pertinent diagnoses were discussed with the patient. Patient was given return precautions    Trigo Winterbottom, MD 02/21/19 [phone]

## 2019-02-21 NOTE — ED Triage Notes (Signed)
Pt reports that he has a colonoscopy yesterday and had a polyp removed. He is experiencing  LLQ abdominal pain that started tonight consistently tonight, but was intermittent throughout the day. Denies bleeding or N/V/D.

## 2019-02-22 ENCOUNTER — Emergency Department (HOSPITAL_COMMUNITY): Payer: BC Managed Care – PPO

## 2019-02-22 ENCOUNTER — Other Ambulatory Visit: Payer: Self-pay

## 2019-02-22 ENCOUNTER — Encounter (HOSPITAL_COMMUNITY): Payer: Self-pay | Admitting: Emergency Medicine

## 2019-02-22 ENCOUNTER — Encounter (HOSPITAL_COMMUNITY): Admission: EM | Disposition: A | Discharge status: Home / Self Care | Payer: Self-pay | Source: Home / Self Care

## 2019-02-22 ENCOUNTER — Inpatient Hospital Stay (HOSPITAL_COMMUNITY)
Admission: EM | Admit: 2019-02-22 | Discharge: 2019-02-26 | DRG: 907 | Disposition: A | Discharge status: Home / Self Care | Payer: BC Managed Care – PPO | Attending: Surgery | Admitting: Surgery

## 2019-02-22 DIAGNOSIS — K658 Other peritonitis: Secondary | ICD-10-CM | POA: Diagnosis present

## 2019-02-22 DIAGNOSIS — K668 Other specified disorders of peritoneum: Secondary | ICD-10-CM | POA: Diagnosis not present

## 2019-02-22 DIAGNOSIS — Y658 Other specified misadventures during surgical and medical care: Secondary | ICD-10-CM | POA: Diagnosis present

## 2019-02-22 DIAGNOSIS — M459 Ankylosing spondylitis of unspecified sites in spine: Secondary | ICD-10-CM | POA: Diagnosis present

## 2019-02-22 DIAGNOSIS — K9171 Accidental puncture and laceration of a digestive system organ or structure during a digestive system procedure: Secondary | ICD-10-CM | POA: Diagnosis not present

## 2019-02-22 DIAGNOSIS — K219 Gastro-esophageal reflux disease without esophagitis: Secondary | ICD-10-CM | POA: Diagnosis not present

## 2019-02-22 DIAGNOSIS — D62 Acute posthemorrhagic anemia: Secondary | ICD-10-CM | POA: Diagnosis not present

## 2019-02-22 DIAGNOSIS — Z20828 Contact with and (suspected) exposure to other viral communicable diseases: Secondary | ICD-10-CM | POA: Diagnosis present

## 2019-02-22 DIAGNOSIS — K631 Perforation of intestine (nontraumatic): Secondary | ICD-10-CM

## 2019-02-22 DIAGNOSIS — K567 Ileus, unspecified: Secondary | ICD-10-CM | POA: Diagnosis not present

## 2019-02-22 DIAGNOSIS — Z03818 Encounter for observation for suspected exposure to other biological agents ruled out: Secondary | ICD-10-CM | POA: Diagnosis not present

## 2019-02-22 DIAGNOSIS — R109 Unspecified abdominal pain: Secondary | ICD-10-CM | POA: Diagnosis not present

## 2019-02-22 DIAGNOSIS — Z880 Allergy status to penicillin: Secondary | ICD-10-CM | POA: Diagnosis not present

## 2019-02-22 DIAGNOSIS — M1990 Unspecified osteoarthritis, unspecified site: Secondary | ICD-10-CM | POA: Diagnosis present

## 2019-02-22 DIAGNOSIS — K650 Generalized (acute) peritonitis: Secondary | ICD-10-CM | POA: Diagnosis not present

## 2019-02-22 HISTORY — PX: LAPAROSCOPIC APPENDECTOMY: SHX408

## 2019-02-22 HISTORY — PX: LAPAROSCOPIC ABDOMINAL EXPLORATION: SHX6249

## 2019-02-22 LAB — COMPREHENSIVE METABOLIC PANEL
ALT: 93 U/L — ABNORMAL HIGH (ref 0–44)
AST: 43 U/L — ABNORMAL HIGH (ref 15–41)
Albumin: 3.6 g/dL (ref 3.5–5.0)
Alkaline Phosphatase: 107 U/L (ref 38–126)
Anion gap: 11 (ref 5–15)
BUN: 12 mg/dL (ref 6–20)
CO2: 23 mmol/L (ref 22–32)
Calcium: 9 mg/dL (ref 8.9–10.3)
Chloride: 96 mmol/L — ABNORMAL LOW (ref 98–111)
Creatinine, Ser: 1.16 mg/dL (ref 0.61–1.24)
GFR calc Af Amer: >60 mL/min (ref >60)
GFR calc non Af Amer: >60 mL/min (ref >60)
Glucose, Bld: 140 mg/dL — ABNORMAL HIGH (ref 70–99)
Potassium: 3.7 mmol/L (ref 3.5–5.1)
Sodium: 130 mmol/L — ABNORMAL LOW (ref 135–145)
Total Bilirubin: 3.3 mg/dL — ABNORMAL HIGH (ref 0.3–1.2)
Total Protein: 7.1 g/dL (ref 6.5–8.1)

## 2019-02-22 LAB — URINALYSIS, ROUTINE W REFLEX MICROSCOPIC
Bacteria, UA: NONE SEEN
Bilirubin Urine: NEGATIVE
Glucose, UA: NEGATIVE mg/dL
Ketones, ur: 5 mg/dL — AB
Leukocytes,Ua: NEGATIVE
Nitrite: NEGATIVE
Protein, ur: NEGATIVE mg/dL
Specific Gravity, Urine: 1.008 (ref 1.005–1.030)
pH: 6 (ref 5.0–8.0)

## 2019-02-22 LAB — CBC
HCT: 43.8 % (ref 39.0–52.0)
Hemoglobin: 15 g/dL (ref 13.0–17.0)
MCH: 31.1 pg (ref 26.0–34.0)
MCHC: 34.2 g/dL (ref 30.0–36.0)
MCV: 90.9 fL (ref 80.0–100.0)
Platelets: 213 10*3/uL (ref 150–400)
RBC: 4.82 MIL/uL (ref 4.22–5.81)
RDW: 12.6 % (ref 11.5–15.5)
WBC: 19.8 10*3/uL — ABNORMAL HIGH (ref 4.0–10.5)
nRBC: 0 % (ref 0.0–0.2)

## 2019-02-22 LAB — SARS CORONAVIRUS 2 BY RT PCR (HOSPITAL ORDER, PERFORMED IN ~~LOC~~ HOSPITAL LAB): SARS Coronavirus 2: NEGATIVE

## 2019-02-22 LAB — LIPASE, BLOOD: Lipase: 20 U/L (ref 11–51)

## 2019-02-22 SURGERY — EXPLORATION, ABDOMEN, LAPAROSCOPIC
Anesthesia: General | Site: Abdomen

## 2019-02-22 MED ORDER — METRONIDAZOLE IN NACL 5-0.79 MG/ML-% IV SOLN
500.0000 mg | Freq: Once | INTRAVENOUS | Status: AC
  Administered 2019-02-22: 500 mg via INTRAVENOUS
  Filled 2019-02-22: qty 100

## 2019-02-22 MED ORDER — ONDANSETRON HCL 4 MG/2ML IJ SOLN
4.0000 mg | Freq: Once | INTRAMUSCULAR | Status: AC
  Administered 2019-02-22: 4 mg via INTRAVENOUS
  Filled 2019-02-22: qty 2

## 2019-02-22 MED ORDER — IOHEXOL 300 MG/ML  SOLN
100.0000 mL | Freq: Once | INTRAMUSCULAR | Status: AC | PRN
  Administered 2019-02-22: 100 mL via INTRAVENOUS

## 2019-02-22 MED ORDER — SODIUM CHLORIDE 0.9 % IV SOLN: 1000.0000 mL | INTRAVENOUS | Status: DC

## 2019-02-22 MED ORDER — MIDAZOLAM HCL 2 MG/2ML IJ SOLN
INTRAMUSCULAR | Status: AC
  Filled 2019-02-22: qty 2

## 2019-02-22 MED ORDER — FENTANYL CITRATE (PF) 250 MCG/5ML IJ SOLN
INTRAMUSCULAR | Status: AC
  Filled 2019-02-22: qty 5

## 2019-02-22 MED ORDER — SODIUM CHLORIDE 0.9 % IV BOLUS (SEPSIS)
2000.0000 mL | Freq: Once | INTRAVENOUS | Status: AC
  Administered 2019-02-22: 2000 mL via INTRAVENOUS

## 2019-02-22 MED ORDER — SODIUM CHLORIDE 0.9 % IV SOLN
2.0000 g | Freq: Once | INTRAVENOUS | Status: AC
  Administered 2019-02-22: 2 g via INTRAVENOUS
  Filled 2019-02-22: qty 2

## 2019-02-22 MED ORDER — PROPOFOL 10 MG/ML IV BOLUS
INTRAVENOUS | Status: AC
  Filled 2019-02-22: qty 20

## 2019-02-22 MED ORDER — SODIUM CHLORIDE (PF) 0.9 % IJ SOLN
INTRAMUSCULAR | Status: AC
  Filled 2019-02-22: qty 50

## 2019-02-22 MED ORDER — SODIUM CHLORIDE 0.9% FLUSH
3.0000 mL | Freq: Once | INTRAVENOUS | Status: AC
  Administered 2019-02-22: 3 mL via INTRAVENOUS

## 2019-02-22 MED ORDER — MORPHINE SULFATE (PF) 4 MG/ML IV SOLN
4.0000 mg | Freq: Once | INTRAVENOUS | Status: AC
  Administered 2019-02-22: 4 mg via INTRAVENOUS
  Filled 2019-02-22: qty 1

## 2019-02-22 SURGICAL SUPPLY — 74 items
APPLIER CLIP 5 13 M/L LIGAMAX5 (MISCELLANEOUS)
APPLIER CLIP ROT 10 11.4 M/L (STAPLE)
BLADE EXTENDED COATED 6.5IN (ELECTRODE) IMPLANT
BLADE HEX COATED 2.75 (ELECTRODE) IMPLANT
CABLE HIGH FREQUENCY MONO STRZ (ELECTRODE) ×3 IMPLANT
CELLS DAT CNTRL 66122 CELL SVR (MISCELLANEOUS) IMPLANT
CLIP APPLIE 5 13 M/L LIGAMAX5 (MISCELLANEOUS) IMPLANT
CLIP APPLIE ROT 10 11.4 M/L (STAPLE) IMPLANT
COVER SURGICAL LIGHT HANDLE (MISCELLANEOUS) ×3 IMPLANT
CUTTER FLEX LINEAR 45M (STAPLE) ×3 IMPLANT
DECANTER SPIKE VIAL GLASS SM (MISCELLANEOUS) ×3 IMPLANT
DERMABOND ADVANCED (GAUZE/BANDAGES/DRESSINGS) ×1
DERMABOND ADVANCED .7 DNX12 (GAUZE/BANDAGES/DRESSINGS) ×2 IMPLANT
DISSECTOR BLUNT TIP ENDO 5MM (MISCELLANEOUS) IMPLANT
DRAIN CHANNEL 19F RND (DRAIN) IMPLANT
DRAPE UTILITY XL STRL (DRAPES) ×3 IMPLANT
DRSG OPSITE POSTOP 4X10 (GAUZE/BANDAGES/DRESSINGS) IMPLANT
DRSG OPSITE POSTOP 4X6 (GAUZE/BANDAGES/DRESSINGS) IMPLANT
DRSG OPSITE POSTOP 4X8 (GAUZE/BANDAGES/DRESSINGS) IMPLANT
ELECT PENCIL ROCKER SW 15FT (MISCELLANEOUS) ×3 IMPLANT
ELECT REM PT RETURN 15FT ADLT (MISCELLANEOUS) ×3 IMPLANT
EVACUATOR SILICONE 100CC (DRAIN) IMPLANT
GAUZE SPONGE 4X4 12PLY STRL (GAUZE/BANDAGES/DRESSINGS) IMPLANT
GLOVE BIOGEL PI IND STRL 7.5 (GLOVE) ×2 IMPLANT
GLOVE BIOGEL PI IND STRL 8 (GLOVE) ×2 IMPLANT
GLOVE BIOGEL PI INDICATOR 7.5 (GLOVE) ×1
GLOVE BIOGEL PI INDICATOR 8 (GLOVE) ×1
GLOVE ECLIPSE 8.0 STRL XLNG CF (GLOVE) ×3 IMPLANT
GLOVE SURG SYN 7.5  E (GLOVE) ×2
GLOVE SURG SYN 7.5 E (GLOVE) ×4 IMPLANT
GOWN STRL REUS W/ TWL XL LVL3 (GOWN DISPOSABLE) ×2 IMPLANT
GOWN STRL REUS W/TWL XL LVL3 (GOWN DISPOSABLE) ×7 IMPLANT
HOLDER FOLEY CATH W/STRAP (MISCELLANEOUS) ×3 IMPLANT
KIT TURNOVER KIT A (KITS) ×3 IMPLANT
NEEDLE HYPO 25X1 1.5 SAFETY (NEEDLE) ×3 IMPLANT
PACK COLON (CUSTOM PROCEDURE TRAY) ×3 IMPLANT
PORT LAP GEL ALEXIS MED 5-9CM (MISCELLANEOUS) IMPLANT
PROTECTOR NERVE ULNAR (MISCELLANEOUS) ×3 IMPLANT
RELOAD STAPLE TA45 3.5 REG BLU (ENDOMECHANICALS) ×3 IMPLANT
RTRCTR WOUND ALEXIS 18CM MED (MISCELLANEOUS)
SCISSORS LAP 5X35 DISP (ENDOMECHANICALS) ×3 IMPLANT
SET IRRIG TUBING LAPAROSCOPIC (IRRIGATION / IRRIGATOR) ×3 IMPLANT
SET TUBE SMOKE EVAC HIGH FLOW (TUBING) ×3 IMPLANT
SHEARS HARMONIC ACE PLUS 36CM (ENDOMECHANICALS) ×3 IMPLANT
SLEEVE ADV FIXATION 5X100MM (TROCAR) ×3 IMPLANT
STAPLER VISISTAT 35W (STAPLE) ×3 IMPLANT
SURGILUBE 2OZ TUBE FLIPTOP (MISCELLANEOUS) IMPLANT
SUT ETHILON 3 0 PS 1 (SUTURE) IMPLANT
SUT MNCRL AB 4-0 PS2 18 (SUTURE) IMPLANT
SUT MON AB 4-0 SH 27 (SUTURE) ×3 IMPLANT
SUT NOVA NAB DX-16 0-1 5-0 T12 (SUTURE) IMPLANT
SUT PDS AB 1 CTX 36 (SUTURE) IMPLANT
SUT PDS AB 1 TP1 96 (SUTURE) IMPLANT
SUT PROLENE 2 0 KS (SUTURE) IMPLANT
SUT PROLENE 2 0 SH DA (SUTURE) IMPLANT
SUT SILK 2 0 (SUTURE)
SUT SILK 2 0 SH CR/8 (SUTURE) IMPLANT
SUT SILK 2-0 18XBRD TIE 12 (SUTURE) IMPLANT
SUT SILK 3 0 (SUTURE)
SUT SILK 3 0 SH CR/8 (SUTURE) IMPLANT
SUT SILK 3-0 18XBRD TIE 12 (SUTURE) IMPLANT
SUT VIC AB 2-0 SH 27 (SUTURE) ×4
SUT VIC AB 2-0 SH 27X BRD (SUTURE) ×8 IMPLANT
SUT VIC AB 3-0 SH 18 (SUTURE) IMPLANT
SUT VICRYL 0 UR6 27IN ABS (SUTURE) ×3 IMPLANT
SYS LAPSCP GELPORT 120MM (MISCELLANEOUS)
SYSTEM LAPSCP GELPORT 120MM (MISCELLANEOUS) IMPLANT
TOWEL OR NON WOVEN STRL DISP B (DISPOSABLE) ×3 IMPLANT
TRAY FOLEY MTR SLVR 16FR STAT (SET/KITS/TRAYS/PACK) ×3 IMPLANT
TROCAR ADV FIXATION 11X100MM (TROCAR) IMPLANT
TROCAR ADV FIXATION 12X100MM (TROCAR) IMPLANT
TROCAR ADV FIXATION 5X100MM (TROCAR) ×3 IMPLANT
TROCAR BLADELESS OPT 5 100 (ENDOMECHANICALS) ×3 IMPLANT
TROCAR XCEL BLUNT TIP 100MML (ENDOMECHANICALS) IMPLANT

## 2019-02-22 NOTE — Anesthesia Preprocedure Evaluation (Signed)
Anesthesia Evaluation  Patient identified by MRN, date of birth, ID band Patient awake    Reviewed: Allergy & Precautions, NPO status , Patient's Chart, lab work & pertinent test results  Airway Mallampati: II  TM Distance: >3 FB Neck ROM: Full    Dental  (+) Dental Advisory Given   Pulmonary neg pulmonary ROS,    Pulmonary exam normal breath sounds clear to auscultation       Cardiovascular negative cardio ROS Normal cardiovascular exam Rhythm:Regular Rate:Normal     Neuro/Psych negative neurological ROS  negative psych ROS   GI/Hepatic Neg liver ROS, GERD  ,  Endo/Other  negative endocrine ROS  Renal/GU negative Renal ROS     Musculoskeletal  (+) Arthritis ,   Abdominal   Peds  Hematology negative hematology ROS (+)   Anesthesia Other Findings   Reproductive/Obstetrics negative OB ROS                             Anesthesia Physical Anesthesia Plan  ASA: II and emergent  Anesthesia Plan: General   Post-op Pain Management:    Induction: Intravenous  PONV Risk Score and Plan: 4 or greater and Ondansetron, Dexamethasone, Midazolam and Treatment may vary due to age or medical condition  Airway Management Planned: Oral ETT  Additional Equipment: None  Intra-op Plan:   Post-operative Plan: Extubation in OR  Informed Consent: I have reviewed the patients History and Physical, chart, labs and discussed the procedure including the risks, benefits and alternatives for the proposed anesthesia with the patient or authorized representative who has indicated his/her understanding and acceptance.     Dental advisory given  Plan Discussed with: CRNA  Anesthesia Plan Comments:         Anesthesia Quick Evaluation

## 2019-02-22 NOTE — Consult Note (Signed)
Re:   Edward Ross DOB:   Sep 26, 1970 MRN:   [id number]  Chief Complaint Abdominal pain  ASSESEMENT AND PLAN: 1.  Probable right colon perforation post colonoscopy  Plan laparoscopic exploration with possible colectomy/colostomy  I explained the options of observation vs surgery with the patient and his wife - but I think in light of worsening symptoms, he is better served with surgery.  I discussed laparoscopic and open surgery.  I discussed the possibility of bowel resection and ostomy.  All this would depend on how much inflammation there is and what the injury looks like.   The risks include bleeding, infection (which he has), leak from the bowel, and the need for more surgery.  2.  Ankylosing spondylitis  3.  History of anal fissure  Chief Complaint  Patient presents with   Abdominal Pain   PHYSICIAN REQUESTING CONSULTATION: Edward Blamer, MD  HISTORY OF PRESENT ILLNESS: Edward Ross is a 48 y.o. (DOB: 12-07-1970)  white male whose primary care physician is Edward Blamer, MD   He is accompanied by his wife, Edward Ross.   Dr. Matthias Hughs was in the room when I saw the patient.   The patient had an anal fissure with some blood in his stool.  The fissure is better.  But this prompted a recommendation for colonoscopy.  He had a colonoscopy by Dr. Marca Ancona on Friday, October 23.  He had a single 6 mm polyp in his right colon which was removed with hot cautery.  He did well with colonoscopy, but the next morning, 10/24, developed vague abdominal pain.  He came to the emergency room Saturday night (10/24)/Sunday morning (10/25) and underwent an evaluation.  A CT scan on 02/21/2019 revealed some stranding of his terminal ileum.  He was thought to have a gastroenteritis.  He was released.  However, he has had worsening right-sided abdominal pain.  He came back to the emergency room today or on plain films he has a pneumoperitoneum.  He had a cholecystectomy in 2016.  He has no stomach, liver, or  pancreas disease.  CT scan - 02/22/2019 - 1. New free intraperitoneal air layering anterior to the liver, compatible with a perforated viscus. Small volume of intermediate attenuation fluid in the right lower quadrant suspicious for succus.  2. Worsening distal ileal mural thickening and hyperemia with adjacent phlegmonous change. Small focus of extraluminal gas adjacent the wall of the terminal ileum, may suggest site of possible perforation.  3. Borderline air and fluid distention of the distal small bowel may reflect a developing ileus, less likely obstruction in the absence of discrete transition point.  4. Increased reactive thickening of the cecum is noted as well.  5. Thickening of the right lower quadrant anterior peritoneal surface compatible with peritonitis.  WBC - 02/22/2019 - 19,800 (up from yesterday)  Past Medical History:  Diagnosis Date   Acid reflux    Arthritis      History reviewed. No pertinent surgical history.    Current Facility-Administered Medications  Medication Dose Route Frequency Provider Last Rate Last Dose   sodium chloride 0.9 % bolus 2,000 mL  2,000 mL Intravenous Once Linwood Dibbles, MD 999 mL/hr at 02/22/19 2103 2,000 mL at 02/22/19 2103   Followed by   0.9 %  sodium chloride infusion  1,000 mL Intravenous Continuous Linwood Dibbles, MD       metroNIDAZOLE (FLAGYL) IVPB 500 mg  500 mg Intravenous Once Linwood Dibbles, MD 100 mL/hr at 02/22/19 2209 500 mg at 02/22/19  2209   sodium chloride (PF) 0.9 % injection            Current Outpatient Medications  Medication Sig Dispense Refill   ciprofloxacin (CIPRO) 500 MG tablet Take 1 tablet (500 mg total) by mouth 2 (two) times daily for 10 days. 20 tablet 0   Glucosamine-Chondroitin 750 & 400 MG MISC Take 1 tablet by mouth 2 (two) times daily.     HYDROcodone-acetaminophen (NORCO/VICODIN) 5-325 MG per tablet Take 2 tablets by mouth every 4 (four) hours as needed for moderate pain. (Patient not taking: Reported  on 02/21/2019) 20 tablet 0   metroNIDAZOLE (FLAGYL) 500 MG tablet Take 1 tablet (500 mg total) by mouth 3 (three) times daily. 30 tablet 0   minoxidil (ROGAINE) 2 % external solution Apply 1 mL topically 2 (two) times daily.     Multiple Vitamin (MULTIVITAMIN WITH MINERALS) TABS tablet Take 1 tablet by mouth daily.     naproxen (NAPROSYN) 375 MG tablet Take 1 tablet (375 mg total) by mouth 2 (two) times daily with a meal. 10 tablet 0   Omega-3 Fatty Acids (FISH OIL) 1200 MG CAPS Take 1,200 mg by mouth daily.     omeprazole-sodium bicarbonate (ZEGERID) 40-1100 MG per capsule Take 1 capsule by mouth daily as needed (for acid reflux).     ondansetron (ZOFRAN) 4 MG tablet Take 1 tablet (4 mg total) by mouth every 6 (six) hours. (Patient not taking: Reported on 02/21/2019) 30 tablet 0      Allergies  Allergen Reactions   Penicillins Rash    Did it involve swelling of the face/tongue/throat, SOB, or low BP? Unknown Did it involve sudden or severe rash/hives, skin peeling, or any reaction on the inside of your mouth or nose? Unknown Did you need to seek medical attention at a hospital or doctor's office? Unknown When did it last happen? If all above answers are NO, may proceed with cephalosporin use.     REVIEW OF SYSTEMS: Skin:  No history of rash.  No history of abnormal moles. Infection:  No history of hepatitis or HIV.  No history of MRSA. Neurologic:  No history of stroke.  No history of seizure.  No history of headaches. Cardiac:  No history of hypertension. No history of heart disease.  No history of prior cardiac catheterization.  No history of seeing a cardiologist. Pulmonary:  Does not smoke cigarettes.  No asthma or bronchitis.  No OSA/CPAP.  Endocrine:  No diabetes. No thyroid disease. Gastrointestinal:  See HPI Urologic:  No history of kidney stones.  No history of bladder infections. Musculoskeletal:  Ankylosing spondylitis.  He has had this for 15 years, takes  occasional NSAID, and is managed by Dr. Tiburcio PeaHarris. Hematologic:  No bleeding disorder.  No history of anemia.  Not anticoagulated. Psycho-social:  The patient is oriented.   The patient has no obvious psychologic or social impairment to understanding our conversation and plan.  SOCIAL and FAMILY HISTORY: Married.  Wife, Edward Ross, with him. He has one 49 yo son.       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  PHYSICAL EXAM: BP 131/79    Pulse (!) 102    Temp 98.6 F (37 C) (Oral)    Resp 16    SpO2 99%   General: WN WM who is alert and generally healthy appearing.  Skin:  Inspection and palpation - no mass or rash. Eyes:  Conjunctiva and lids unremarkable.  Pupils are equal Ears, Nose, Mouth, and Throat:  Ears and nose unremarkable            Lips and teeth are unremarable. Neck: Supple. No mass, trachea midline.  No thyroid mass. Lymph Nodes:  No supraclavicular, cervical, or inguinal nodes. Lungs: Normal respiratory effort.  Clear to auscultation and symmetric breath sounds. Heart:  Palpation of the heart is normal.            Auscultation: RRR. No murmur or rub.  Abdomen: Soft. No mass.  No hernia.             Absent bowel sounds.  Guarding in his right mid and right lower quadrant. Rectal: Not done. Musculoskeletal:  Good muscle strength and ROM  in upper and lower extremities.  Neurologic:  Grossly intact to motor and sensory function. Psychiatric: Normal judgement and insight. Behavior is normal.            Oriented to time, person, place.   DATA REVIEWED, COUNSELING AND COORDINATION OF CARE: Epic notes reviewed. Counseling and coordination of care exceeded more than 50% of the time spent with patient. Total time spent with patient and charting: 45 minutes.  Alphonsa Overall, MD,  [facility], [address]  [place], [place]    [place] Phone:  [phone] FAX:  [phone]

## 2019-02-22 NOTE — ED Provider Notes (Signed)
[facility] DEPT Provider Note   CSN: [id number] Arrival date & time: 02/22/19  1521     History   Chief Complaint Chief Complaint  Patient presents with  . Abdominal Pain    HPI Edward Ross is a 48 y.o. male.     HPI Patient presents to the emergency room with complaints of abdominal pain.  Patient had a colonoscopy on Friday.  He started developing abdominal pain after that procedure.  Patient also began to feel nauseated but did not have any vomiting.  He has had bloating and gas pain and has not had a bowel movement since the procedure.  He came to the emergency room yesterday early in the morning.  Patient had laboratory tests and a CT scan.  The CT scan showed a focal area of enteritis.  Patient was discharged home on a course of antibiotics.  Patient states he does not feel like he is getting any better and in fact feels like he is getting worse.  He continues to feel nauseated.  He denies any vomiting.  He denies any diarrhea.    Medical records reviewed from previous visit.  Patient was given prescription for Cipro, metronidazole, and Naprosyn. Past Medical History:  Diagnosis Date  . Acid reflux   . Arthritis     There are no active problems to display for this patient.   History reviewed. No pertinent surgical history.      Home Medications    Prior to Admission medications   Medication Sig Start Date End Date Taking? Authorizing Provider  ciprofloxacin (CIPRO) 500 MG tablet Take 1 tablet (500 mg total) by mouth 2 (two) times daily for 10 days. 02/21/19 03/03/19  Palumbo, April, MD  Glucosamine-Chondroitin 750 & 400 MG MISC Take 1 tablet by mouth 2 (two) times daily.    [provider]  HYDROcodone-acetaminophen (NORCO/VICODIN) 5-325 MG per tablet Take 2 tablets by mouth every 4 (four) hours as needed for moderate pain. Patient not taking: Reported on 02/21/2019 08/16/14   Orpah Greek, MD  metroNIDAZOLE  (FLAGYL) 500 MG tablet Take 1 tablet (500 mg total) by mouth 3 (three) times daily. 02/21/19   Palumbo, April, MD  minoxidil (ROGAINE) 2 % external solution Apply 1 mL topically 2 (two) times daily.    [provider]  Multiple Vitamin (MULTIVITAMIN WITH MINERALS) TABS tablet Take 1 tablet by mouth daily.    [provider]  naproxen (NAPROSYN) 375 MG tablet Take 1 tablet (375 mg total) by mouth 2 (two) times daily with a meal. 02/21/19   Palumbo, April, MD  Omega-3 Fatty Acids (FISH OIL) 1200 MG CAPS Take 1,200 mg by mouth daily.    [provider]  omeprazole-sodium bicarbonate (ZEGERID) 40-1100 MG per capsule Take 1 capsule by mouth daily as needed (for acid reflux).    [provider]  ondansetron (ZOFRAN) 4 MG tablet Take 1 tablet (4 mg total) by mouth every 6 (six) hours. Patient not taking: Reported on 02/21/2019 08/16/14   Orpah Greek, MD    Family History No family history on file.  Social History Social History   Tobacco Use  . Smoking status: Never Smoker  Substance Use Topics  . Alcohol use: No  . Drug use: No     Allergies   Penicillins   Review of Systems Review of Systems  All other systems reviewed and are negative.    Physical Exam Updated Vital Signs BP 125/68 (BP Location: Left Arm)  Pulse (!) 105   Temp 98.6 F (37 C) (Oral)   Resp 16   SpO2 99%   Physical Exam Vitals signs and nursing note reviewed.  Constitutional:      General: He is not in acute distress.    Appearance: He is well-developed.  HENT:     Head: Normocephalic and atraumatic.     Right Ear: External ear normal.     Left Ear: External ear normal.  Eyes:     General: No scleral icterus.       Right eye: No discharge.        Left eye: No discharge.     Conjunctiva/sclera: Conjunctivae normal.  Neck:     Musculoskeletal: Neck supple.     Trachea: No tracheal deviation.  Cardiovascular:     Rate and Rhythm: Normal rate and  regular rhythm.  Pulmonary:     Effort: Pulmonary effort is normal. No respiratory distress.     Breath sounds: Normal breath sounds. No stridor. No wheezing or rales.  Abdominal:     General: Bowel sounds are normal. There is no distension.     Palpations: Abdomen is soft.     Tenderness: There is generalized abdominal tenderness. There is no guarding or rebound.  Musculoskeletal:        General: No tenderness.  Skin:    General: Skin is warm and dry.     Findings: No rash.  Neurological:     Mental Status: He is alert.     Cranial Nerves: No cranial nerve deficit (no facial droop, extraocular movements intact, no slurred speech).     Sensory: No sensory deficit.     Motor: No abnormal muscle tone or seizure activity.     Coordination: Coordination normal.      ED Treatments / Results  Labs (all labs ordered are listed, but only abnormal results are displayed) Labs Reviewed  COMPREHENSIVE METABOLIC PANEL - Abnormal; Notable for the following components:      Result Value   Sodium 130 (*)    Chloride 96 (*)    Glucose, Bld 140 (*)    AST 43 (*)    ALT 93 (*)    Total Bilirubin 3.3 (*)    All other components within normal limits  CBC - Abnormal; Notable for the following components:   WBC 19.8 (*)    All other components within normal limits  URINALYSIS, ROUTINE W REFLEX MICROSCOPIC - Abnormal; Notable for the following components:   Hgb urine dipstick SMALL (*)    Ketones, ur 5 (*)    All other components within normal limits  SARS CORONAVIRUS 2 BY RT PCR (HOSPITAL ORDER, PERFORMED IN [facility])  LIPASE, BLOOD    EKG None  Radiology Ct Abdomen Pelvis W Contrast  Result Date: 02/21/2019 CLINICAL DATA:  Initial evaluation for acute left lower quadrant abdominal pain, recent colonoscopy with polypectomy. EXAM: CT ABDOMEN AND PELVIS WITH CONTRAST TECHNIQUE: Multidetector CT imaging of the abdomen and pelvis was performed using the standard protocol  following bolus administration of intravenous contrast. CONTRAST:  OMNIPAQUE IOHEXOL 300 MG/ML  SOLN COMPARISON:  Prior ultrasound from 03/07/2007. FINDINGS: Lower chest: Mild linear atelectatic changes present within the left lower lobe. Visualized lungs are otherwise clear. Hepatobiliary: Liver demonstrates a normal contrast enhanced appearance. Gallbladder surgically absent. No biliary dilatation. Pancreas: Pancreas within normal limits. Spleen: Spleen intact and normal. Adrenals/Urinary Tract: Renal glands within normal limits. Kidneys equal in size with symmetric enhancement.  Approximate 1 cm simple cyst present at the upper pole of the right kidney. No nephrolithiasis, hydronephrosis, or focal enhancing renal mass. No hydroureter. Partially distended bladder within normal limits. Stomach/Bowel: Small hiatal hernia noted. Stomach otherwise unremarkable. Duodenum within normal limits. No evidence for small bowel obstruction. There is mild mucosal enhancement with hazy stranding about the terminal ileum within the right lower quadrant, suspicious for possible acute enteritis (series 2, image 65). This could be either infectious or inflammatory nature. Cecum positioned somewhat midline in the mid pelvis. Appendix is not definitely visualized, however, no secondary signs to suggest acute appendicitis identified. Colon largely decompressed without acute inflammatory changes or other abnormality. No complication related to recent colonoscopy identified. Vascular/Lymphatic: Normal intravascular enhancement seen throughout the intra-abdominal aorta. Mesenteric vessels patent proximally. No adenopathy. Reproductive: Prostate within normal limits. Prominent venous collaterals noted about the prostate. Other: No free air or fluid. Mild within the right aspect of the omentum within the right lower quadrant, suspected to be reactive related to the adjacent enteritis. Vascular congestion noted Musculoskeletal: No acute  osseous abnormality. No discrete lytic or blastic osseous lesions. IMPRESSION: 1. Mild mucosal enhancement with hazy stranding about the terminal ileum within the right lower quadrant, suspicious for possible acute enteritis. This could be either infectious or inflammatory in nature. 2. No other acute intra-abdominal or pelvic process identified. No complication related to recent colonoscopy identified. 3. Status post cholecystectomy. Electronically Signed   By: Rise MuBenjamin  McClintock M.D.   On: 02/21/2019 04:27   Dg Abdomen Acute W/chest  Result Date: 02/22/2019 CLINICAL DATA:  Abdominal pain EXAM: DG ABDOMEN ACUTE W/ 1V CHEST COMPARISON:  CT from the previous day. FINDINGS: Cardiac shadow is within normal limits. The lungs are well aerated bilaterally. Mild left basilar atelectasis is noted. Multiple mildly dilated loops of small bowel are noted with differential air-fluid levels. Additionally there is free air in the right upper abdomen consistent with perforation. This was not present on the CT from the previous day. No bony abnormality is noted. IMPRESSION: Changes are now noted consistent with interval perforation with free air beneath the right hemidiaphragm. Mild small bowel dilatation is seen which has increased in the interval from the prior exam likely related to the underlying distal ileal inflammatory change. Given the free air repeat CT may be helpful for further evaluation. These results will be called to the ordering clinician or representative by the Radiologist Assistant, and communication documented in the PACS or zVision Dashboard. Electronically Signed   By: Alcide CleverMark  Lukens M.D.   On: 02/22/2019 20:17    Procedures .Critical Care Performed by: Linwood DibblesKnapp, Martino Tompson, MD Authorized by: Linwood DibblesKnapp, Xinyi Batton, MD   Critical care provider statement:    Critical care time (minutes):  45   Critical care was time spent personally by me on the following activities:  Discussions with consultants, evaluation of  patient's response to treatment, examination of patient, ordering and performing treatments and interventions, ordering and review of laboratory studies, ordering and review of radiographic studies, pulse oximetry, re-evaluation of patient's condition, obtaining history from patient or surrogate and review of old charts   (including critical care time)  Medications Ordered in ED Medications  sodium chloride 0.9 % bolus 2,000 mL (2,000 mLs Intravenous New Bag/Given 02/22/19 2103)    Followed by  0.9 %  sodium chloride infusion (has no administration in time range)  ceFEPIme (MAXIPIME) 2 g in sodium chloride 0.9 % 100 mL IVPB (has no administration in time range)    And  metroNIDAZOLE (FLAGYL) IVPB 500 mg (has no administration in time range)  sodium chloride flush (NS) 0.9 % injection 3 mL (3 mLs Intravenous Given 02/22/19 2103)  ondansetron (ZOFRAN) injection 4 mg (4 mg Intravenous Given 02/22/19 2105)  morphine 4 MG/ML injection 4 mg (4 mg Intravenous Given 02/22/19 2105)     Initial Impression / Assessment and Plan / ED Course  I have reviewed the triage vital signs and the nursing notes.  Pertinent labs & imaging results that were available during my care of the patient were reviewed by me and considered in my medical decision making (see chart for details).  Clinical Course as of Feb 21 2109  Mon Feb 22, 2019  1959 Labs reviewed.  Hyponatremia new from previous visit.   [JK]  1959 Bilirubin elevated.  Leukocytosis increased.   [JK]  2050 Plain films demonstrate free air under the diaphragm.  Case discussed with Dr. Ezzard Standing.  He requests repeat CT scan.  He will consult on patient after he is done with an OR case.  I will consult with [facility] gastroenterology.   [JK]  2109 Discussed with Dr Clent Ridges.  Will come see pt in the ED.   [JK]    Clinical Course User Index [JK] Linwood Dibbles, MD      Final Clinical Impressions(s) / ED Diagnoses   Final diagnoses:  Bowel perforation  [facility])      Linwood Dibbles, MD 02/22/19 2110

## 2019-02-22 NOTE — Progress Notes (Signed)
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  He felt fine when he left the endoscopy unit, but over the next 24 hours, developed significant right-sided abdominal pain, prompting an ER visit in the early hours of yesterday morning, approximately 36 hours ago, at which time a CT scan showed inflammatory changes but no free air.    He was sent home on antibiotics with some relief of pain, but over the next 24 hours, he had a sense of progressive malaise and nausea, prompting a return visit to the emergency room where free air was noted on a plain film and confirmed on a repeat CT, which showed significant inflammatory changes in the right lower quadrant.  During this period of time, his white count increased from 15,000-19,000.  On exam, the patient actually looks fine and is in no evident distress.  He is mildly tachycardic but normotensive.  The radial pulse is strong.  The abdomen is perhaps minimally distended, quiet bowel sounds, moderate diffuse tenderness (examined after morphine).  The patient has been seen by Dr. Alphonsa Overall of general surgery, who feels that the patient would be best served by having surgery tonight, and I agree.  I appreciate his input and assistance.  Please let us know if we can be of assistance in this patient's care during his hospitalization.  Cleotis Nipper, M.D. Pager [phone] If no answer or after 5 PM call [phone]

## 2019-02-22 NOTE — ED Triage Notes (Signed)
Pt complaint of ongoing bloating and inability to pass gas or have BM since Friday; seen here Saturday.

## 2019-02-23 ENCOUNTER — Emergency Department (HOSPITAL_COMMUNITY): Payer: BC Managed Care – PPO | Admitting: Certified Registered"

## 2019-02-23 ENCOUNTER — Encounter (HOSPITAL_COMMUNITY): Payer: Self-pay | Admitting: Surgery

## 2019-02-23 DIAGNOSIS — D62 Acute posthemorrhagic anemia: Secondary | ICD-10-CM | POA: Diagnosis not present

## 2019-02-23 DIAGNOSIS — Y658 Other specified misadventures during surgical and medical care: Secondary | ICD-10-CM | POA: Diagnosis present

## 2019-02-23 DIAGNOSIS — Z880 Allergy status to penicillin: Secondary | ICD-10-CM | POA: Diagnosis not present

## 2019-02-23 DIAGNOSIS — M1990 Unspecified osteoarthritis, unspecified site: Secondary | ICD-10-CM | POA: Diagnosis not present

## 2019-02-23 DIAGNOSIS — R109 Unspecified abdominal pain: Secondary | ICD-10-CM | POA: Diagnosis present

## 2019-02-23 DIAGNOSIS — Z20828 Contact with and (suspected) exposure to other viral communicable diseases: Secondary | ICD-10-CM | POA: Diagnosis present

## 2019-02-23 DIAGNOSIS — K658 Other peritonitis: Secondary | ICD-10-CM | POA: Diagnosis present

## 2019-02-23 DIAGNOSIS — K219 Gastro-esophageal reflux disease without esophagitis: Secondary | ICD-10-CM | POA: Diagnosis not present

## 2019-02-23 DIAGNOSIS — K631 Perforation of intestine (nontraumatic): Secondary | ICD-10-CM | POA: Diagnosis not present

## 2019-02-23 DIAGNOSIS — M459 Ankylosing spondylitis of unspecified sites in spine: Secondary | ICD-10-CM | POA: Diagnosis present

## 2019-02-23 DIAGNOSIS — K567 Ileus, unspecified: Secondary | ICD-10-CM | POA: Diagnosis not present

## 2019-02-23 DIAGNOSIS — K9171 Accidental puncture and laceration of a digestive system organ or structure during a digestive system procedure: Secondary | ICD-10-CM | POA: Diagnosis present

## 2019-02-23 LAB — CBC WITH DIFFERENTIAL/PLATELET
Abs Immature Granulocytes: 0.34 10*3/uL — ABNORMAL HIGH (ref 0.00–0.07)
Basophils Absolute: 0 10*3/uL (ref 0.0–0.1)
Basophils Relative: 0 %
Eosinophils Absolute: 0 10*3/uL (ref 0.0–0.5)
Eosinophils Relative: 0 %
HCT: 39.9 % (ref 39.0–52.0)
Hemoglobin: 13.5 g/dL (ref 13.0–17.0)
Immature Granulocytes: 2 %
Lymphocytes Relative: 1 %
Lymphs Abs: 0.2 10*3/uL — ABNORMAL LOW (ref 0.7–4.0)
MCH: 31 pg (ref 26.0–34.0)
MCHC: 33.8 g/dL (ref 30.0–36.0)
MCV: 91.7 fL (ref 80.0–100.0)
Monocytes Absolute: 0.5 10*3/uL (ref 0.1–1.0)
Monocytes Relative: 3 %
Neutro Abs: 16.8 10*3/uL — ABNORMAL HIGH (ref 1.7–7.7)
Neutrophils Relative %: 94 %
Platelets: 212 10*3/uL (ref 150–400)
RBC: 4.35 MIL/uL (ref 4.22–5.81)
RDW: 12.8 % (ref 11.5–15.5)
WBC: 17.9 10*3/uL — ABNORMAL HIGH (ref 4.0–10.5)
nRBC: 0 % (ref 0.0–0.2)

## 2019-02-23 LAB — HEPATIC FUNCTION PANEL
ALT: 62 U/L — ABNORMAL HIGH (ref 0–44)
AST: 23 U/L (ref 15–41)
Albumin: 2.9 g/dL — ABNORMAL LOW (ref 3.5–5.0)
Alkaline Phosphatase: 103 U/L (ref 38–126)
Bilirubin, Direct: 0.8 mg/dL — ABNORMAL HIGH (ref 0.0–0.2)
Indirect Bilirubin: 1 mg/dL — ABNORMAL HIGH (ref 0.3–0.9)
Total Bilirubin: 1.8 mg/dL — ABNORMAL HIGH (ref 0.3–1.2)
Total Protein: 6.2 g/dL — ABNORMAL LOW (ref 6.5–8.1)

## 2019-02-23 LAB — BASIC METABOLIC PANEL
Anion gap: 9 (ref 5–15)
BUN: 10 mg/dL (ref 6–20)
CO2: 23 mmol/L (ref 22–32)
Calcium: 8.2 mg/dL — ABNORMAL LOW (ref 8.9–10.3)
Chloride: 105 mmol/L (ref 98–111)
Creatinine, Ser: 0.91 mg/dL (ref 0.61–1.24)
GFR calc Af Amer: >60 mL/min (ref >60)
GFR calc non Af Amer: >60 mL/min (ref >60)
Glucose, Bld: 162 mg/dL — ABNORMAL HIGH (ref 70–99)
Potassium: 3.8 mmol/L (ref 3.5–5.1)
Sodium: 137 mmol/L (ref 135–145)

## 2019-02-23 LAB — TYPE AND SCREEN
ABO/RH(D): A POS
Antibody Screen: NEGATIVE

## 2019-02-23 LAB — ABO/RH: ABO/RH(D): A POS

## 2019-02-23 LAB — GENTAMICIN LEVEL, RANDOM: Gentamicin Rm: 1.5 ug/mL

## 2019-02-23 MED ORDER — GENTAMICIN SULFATE 40 MG/ML IJ SOLN
560.0000 mg | Freq: Every day | INTRAVENOUS | Status: DC
  Administered 2019-02-23: 560 mg via INTRAVENOUS
  Filled 2019-02-23 (×2): qty 14

## 2019-02-23 MED ORDER — PANTOPRAZOLE SODIUM 40 MG IV SOLR
40.0000 mg | Freq: Every day | INTRAVENOUS | Status: DC
  Administered 2019-02-23 – 2019-02-24 (×2): 40 mg via INTRAVENOUS
  Filled 2019-02-23 (×2): qty 40

## 2019-02-23 MED ORDER — METRONIDAZOLE IN NACL 5-0.79 MG/ML-% IV SOLN
500.0000 mg | Freq: Three times a day (TID) | INTRAVENOUS | Status: DC
  Administered 2019-02-23 – 2019-02-26 (×9): 500 mg via INTRAVENOUS
  Filled 2019-02-23 (×9): qty 100

## 2019-02-23 MED ORDER — SUCCINYLCHOLINE CHLORIDE 200 MG/10ML IV SOSY
PREFILLED_SYRINGE | INTRAVENOUS | Status: DC | PRN
  Administered 2019-02-23: 160 mg via INTRAVENOUS

## 2019-02-23 MED ORDER — ROCURONIUM BROMIDE 10 MG/ML (PF) SYRINGE
PREFILLED_SYRINGE | INTRAVENOUS | Status: AC
  Filled 2019-02-23: qty 20

## 2019-02-23 MED ORDER — ENOXAPARIN SODIUM 40 MG/0.4ML ~~LOC~~ SOLN
40.0000 mg | SUBCUTANEOUS | Status: DC
  Administered 2019-02-23 – 2019-02-25 (×3): 40 mg via SUBCUTANEOUS
  Filled 2019-02-23 (×5): qty 0.4

## 2019-02-23 MED ORDER — MEPERIDINE HCL 50 MG/ML IJ SOLN: 6.2500 mg | INTRAMUSCULAR | Status: DC | PRN

## 2019-02-23 MED ORDER — HYDROMORPHONE HCL 1 MG/ML IJ SOLN: 0.2500 mg | INTRAMUSCULAR | Status: DC | PRN

## 2019-02-23 MED ORDER — PHENYLEPHRINE 40 MCG/ML (10ML) SYRINGE FOR IV PUSH (FOR BLOOD PRESSURE SUPPORT)
PREFILLED_SYRINGE | INTRAVENOUS | Status: DC | PRN
  Administered 2019-02-23 (×3): 80 ug via INTRAVENOUS
  Administered 2019-02-23 (×2): 120 ug via INTRAVENOUS

## 2019-02-23 MED ORDER — KETOROLAC TROMETHAMINE 15 MG/ML IJ SOLN
INTRAMUSCULAR | Status: AC
  Filled 2019-02-23: qty 1

## 2019-02-23 MED ORDER — ENOXAPARIN SODIUM 40 MG/0.4ML ~~LOC~~ SOLN: 40.0000 mg | SUBCUTANEOUS | Status: DC

## 2019-02-23 MED ORDER — TRAMADOL HCL 50 MG PO TABS: 50.0000 mg | ORAL_TABLET | Freq: Four times a day (QID) | ORAL | Status: DC | PRN

## 2019-02-23 MED ORDER — PROPOFOL 10 MG/ML IV BOLUS
INTRAVENOUS | Status: DC | PRN
  Administered 2019-02-23: 170 mg via INTRAVENOUS

## 2019-02-23 MED ORDER — MORPHINE SULFATE (PF) 4 MG/ML IV SOLN: 1.0000 mg | INTRAVENOUS | Status: DC | PRN

## 2019-02-23 MED ORDER — PROMETHAZINE HCL 25 MG/ML IJ SOLN: 6.2500 mg | INTRAMUSCULAR | Status: DC | PRN

## 2019-02-23 MED ORDER — BUPIVACAINE HCL (PF) 0.25 % IJ SOLN
INTRAMUSCULAR | Status: DC | PRN
  Administered 2019-02-23: 30 mL

## 2019-02-23 MED ORDER — 0.9 % SODIUM CHLORIDE (POUR BTL) OPTIME
TOPICAL | Status: DC | PRN
  Administered 2019-02-23: 1000 mL

## 2019-02-23 MED ORDER — ONDANSETRON HCL 4 MG/2ML IJ SOLN
INTRAMUSCULAR | Status: AC
  Filled 2019-02-23: qty 2

## 2019-02-23 MED ORDER — ONDANSETRON HCL 4 MG/2ML IJ SOLN
4.0000 mg | Freq: Four times a day (QID) | INTRAMUSCULAR | Status: DC | PRN
  Administered 2019-02-23: 4 mg via INTRAVENOUS

## 2019-02-23 MED ORDER — POTASSIUM CL IN DEXTROSE 5% 20 MEQ/L IV SOLN
20.0000 meq | INTRAVENOUS | Status: DC
  Administered 2019-02-23: 20 meq via INTRAVENOUS
  Filled 2019-02-23 (×3): qty 1000

## 2019-02-23 MED ORDER — CIPROFLOXACIN IN D5W 400 MG/200ML IV SOLN
400.0000 mg | Freq: Two times a day (BID) | INTRAVENOUS | Status: DC
  Administered 2019-02-23 – 2019-02-24 (×3): 400 mg via INTRAVENOUS
  Filled 2019-02-23 (×3): qty 200

## 2019-02-23 MED ORDER — LACTATED RINGERS IV SOLN
INTRAVENOUS | Status: DC | PRN
  Administered 2019-02-23 (×2): via INTRAVENOUS

## 2019-02-23 MED ORDER — FAMOTIDINE IN NACL 20-0.9 MG/50ML-% IV SOLN
20.0000 mg | Freq: Two times a day (BID) | INTRAVENOUS | Status: DC
  Administered 2019-02-23 – 2019-02-24 (×4): 20 mg via INTRAVENOUS
  Filled 2019-02-23 (×4): qty 50

## 2019-02-23 MED ORDER — ONDANSETRON HCL 4 MG/2ML IJ SOLN
INTRAMUSCULAR | Status: DC | PRN
  Administered 2019-02-23: 4 mg via INTRAVENOUS

## 2019-02-23 MED ORDER — LACTATED RINGERS IR SOLN
Status: DC | PRN
Start: 2019-02-23 — End: 2019-02-23
  Administered 2019-02-23: 6000 mL

## 2019-02-23 MED ORDER — ROCURONIUM BROMIDE 10 MG/ML (PF) SYRINGE
PREFILLED_SYRINGE | INTRAVENOUS | Status: DC | PRN
  Administered 2019-02-23: 40 mg via INTRAVENOUS
  Administered 2019-02-23: 5 mg via INTRAVENOUS
  Administered 2019-02-23: 10 mg via INTRAVENOUS

## 2019-02-23 MED ORDER — KETOROLAC TROMETHAMINE 30 MG/ML IJ SOLN
15.0000 mg | Freq: Three times a day (TID) | INTRAMUSCULAR | Status: AC
  Administered 2019-02-23 – 2019-02-26 (×8): 15 mg via INTRAVENOUS
  Filled 2019-02-23 (×7): qty 1

## 2019-02-23 MED ORDER — SUCCINYLCHOLINE CHLORIDE 200 MG/10ML IV SOSY
PREFILLED_SYRINGE | INTRAVENOUS | Status: AC
  Filled 2019-02-23: qty 20

## 2019-02-23 MED ORDER — BUPIVACAINE HCL (PF) 0.25 % IJ SOLN
INTRAMUSCULAR | Status: AC
  Filled 2019-02-23: qty 30

## 2019-02-23 MED ORDER — ACETAMINOPHEN 325 MG PO TABS
650.0000 mg | ORAL_TABLET | Freq: Four times a day (QID) | ORAL | Status: DC
  Administered 2019-02-23 – 2019-02-24 (×3): 650 mg via ORAL
  Filled 2019-02-23 (×3): qty 2

## 2019-02-23 MED ORDER — ONDANSETRON 4 MG PO TBDP: 4.0000 mg | ORAL_TABLET | Freq: Four times a day (QID) | ORAL | Status: DC | PRN

## 2019-02-23 MED ORDER — FENTANYL CITRATE (PF) 250 MCG/5ML IJ SOLN
INTRAMUSCULAR | Status: AC
  Filled 2019-02-23: qty 5

## 2019-02-23 MED ORDER — LIP MEDEX EX OINT
TOPICAL_OINTMENT | CUTANEOUS | Status: AC
  Filled 2019-02-23: qty 7

## 2019-02-23 MED ORDER — MIDAZOLAM HCL 2 MG/2ML IJ SOLN
INTRAMUSCULAR | Status: DC | PRN
  Administered 2019-02-23: 2 mg via INTRAVENOUS

## 2019-02-23 MED ORDER — EPHEDRINE 5 MG/ML INJ
INTRAVENOUS | Status: AC
  Filled 2019-02-23: qty 20

## 2019-02-23 MED ORDER — HYDROCODONE-ACETAMINOPHEN 5-325 MG PO TABS: 1.0000 | ORAL_TABLET | ORAL | Status: DC | PRN

## 2019-02-23 MED ORDER — FENTANYL CITRATE (PF) 250 MCG/5ML IJ SOLN
INTRAMUSCULAR | Status: DC | PRN
  Administered 2019-02-23 (×2): 100 ug via INTRAVENOUS
  Administered 2019-02-23 (×6): 50 ug via INTRAVENOUS

## 2019-02-23 MED ORDER — SUGAMMADEX SODIUM 200 MG/2ML IV SOLN
INTRAVENOUS | Status: DC | PRN
  Administered 2019-02-23: 200 mg via INTRAVENOUS

## 2019-02-23 MED ORDER — DEXAMETHASONE SODIUM PHOSPHATE 10 MG/ML IJ SOLN
INTRAMUSCULAR | Status: DC | PRN
  Administered 2019-02-23: 10 mg via INTRAVENOUS

## 2019-02-23 MED ORDER — LIDOCAINE 2% (20 MG/ML) 5 ML SYRINGE
INTRAMUSCULAR | Status: DC | PRN
  Administered 2019-02-23: 100 mg via INTRAVENOUS
  Administered 2019-02-23: 40 mg via INTRAVENOUS

## 2019-02-23 MED ORDER — PHENYLEPHRINE 40 MCG/ML (10ML) SYRINGE FOR IV PUSH (FOR BLOOD PRESSURE SUPPORT)
PREFILLED_SYRINGE | INTRAVENOUS | Status: AC
  Filled 2019-02-23: qty 10

## 2019-02-23 NOTE — Anesthesia Postprocedure Evaluation (Signed)
Anesthesia Post Note  Patient: Edward Ross  Procedure(s) Performed: LAPAROSCOPIC ABDOMINAL EXPLORATION WITH REPAIR OF COLONIC PERFORATIONS (Abdomen) APPENDECTOMY LAPAROSCOPIC (Abdomen)     Patient location during evaluation: PACU Anesthesia Type: General Level of consciousness: sedated and patient cooperative Pain management: pain level controlled Vital Signs Assessment: post-procedure vital signs reviewed and stable Respiratory status: spontaneous breathing Cardiovascular status: stable Anesthetic complications: no    Last Vitals:  Vitals:   02/23/19 0545 02/23/19 0700  BP: 136/83   Pulse: (!) 107   Resp: 12   Temp:  36.9 C  SpO2: 97%     Last Pain:  Vitals:   02/23/19 0700  TempSrc:   PainSc: 0-No pain                 Nolon Nations

## 2019-02-23 NOTE — Progress Notes (Addendum)
1 Day Post-Op    CC: Abdominal pain, bloating, nausea and vomiting  Subjective: Patient feels fairly good this a.m.  Is also appreciative of the rest in the PACU.  His port sites all look good. Objective: Vital signs in last 24 hours: Temp:  [98.4 F (36.9 C)-98.6 F (37 C)] 98.4 F (36.9 C) (10/27 0700) Pulse Rate:  [95-119] 107 (10/27 0545) Resp:  [10-26] 12 (10/27 0545) BP: (116-161)/(66-84) 136/83 (10/27 0545) SpO2:  [93 %-99 %] 97 % (10/27 0545)  2100 IV Urine 1300 Afebrile, VSS Labs pending Intake/Output from previous day: 10/26 0701 - 10/27 0700 In: 2100 [I.V.:2000; IV Piggyback:100] Out: 7672 [Urine:1300; Blood:75] Intake/Output this shift: No intake/output data recorded.  General appearance: alert, cooperative and no distress Resp: clear to auscultation bilaterally GI: Soft, sore, port sites all look good, no bowel sounds or flatus so far.  Lab Results:  Recent Labs    02/21/19 0156 02/21/19 0218 02/22/19 1539  WBC 15.3*  --  19.8*  HGB 16.2 16.7 15.0  HCT 49.2 49.0 43.8  PLT 249  --  213    BMET Recent Labs    02/21/19 0156 02/21/19 0218 02/22/19 1539  NA 137 140 130*  K 4.2 4.3 3.7  CL 105 101 96*  CO2 24  --  23  GLUCOSE 122* 117* 140*  BUN 14 14 12   CREATININE 1.24 1.10 1.16  CALCIUM 9.0  --  9.0   PT/INR No results for input(s): LABPROT, INR in the last 72 hours.  Recent Labs  Lab 02/21/19 0156 02/22/19 1539  AST 32 43*  ALT 38 93*  ALKPHOS 95 107  BILITOT 0.7 3.3*  PROT 7.2 7.1  ALBUMIN 4.4 3.6     Lipase     Component Value Date/Time   LIPASE 20 02/22/2019 1539     Medications: . sodium chloride (PF)       . sodium chloride    . ciprofloxacin 400 mg (02/23/19 0514)   And  . metronidazole 500 mg (02/23/19 0620)  . gentamicin 560 mg (02/23/19 0546)    Assessment/Plan Hx anal fissure Hx ankylosing spondylitis GERD  Focal perforation of right mid colon/sp colonoscopy 02/19/19 Dr Therisa Doyne Laparoscopic exploration,  oversew of perforated right colon, laparoscopic appendectomy, 02/22/19 Dr. Alphonsa Overall   FEN:  IV fluids/NPO ID:  Maxipime/Flagyl 10/26;  Cipro/flagyl/garammycin 10/27 >> day 1 DVT: SCD's >> add Lovenox at 2200 hrs tonight Follow up:  Dr. Lucia Gaskins   Plan: Awaiting transfer to floor when a bed is ready.  Give him some ice chips and sips for now.  Nothing more.  Continue antibiotics, will get his Foley out later this today.  Begin to mobilize, and await return of bowel function.  Recheck labs in AM.     LOS: 0 days    Edward Ross 02/23/2019 Please see Amion

## 2019-02-23 NOTE — Progress Notes (Signed)
Pharmacy Antibiotic Note  Edward Ross is a 48 y.o. male admitted on 02/22/2019 with intra-abdominal infection.  Pharmacy has been consulted for gentamicin dosing. Also requested for pharmacy to dose Lovenox for VTE prophylaxis, to start 10/27 at 2200  Plan: Gentamicin 560 mg IV q24h Check 10 hour random vancomycin level as per [place] dosing nomogram at 1600 (today 10/27) Cipro 400mg  IV q12 Flagyl 500mg  IV q8  Lovenox 40mg  q24, adjusted order to begin 2200 tonight 10/27    Temp (24hrs), Avg:98.7 F (37.1 C), Min:98.4 F (36.9 C), Max:99.1 F (37.3 C)  Recent Labs  Lab 02/21/19 0156 02/21/19 0218 02/22/19 1539 02/23/19 0728  WBC 15.3*  --  19.8* 17.9*  CREATININE 1.24 1.10 1.16 0.91    Estimated Creatinine Clearance: 111 mL/min (by C-G formula based on SCr of 0.91 mg/dL).    Allergies  Allergen Reactions  . Penicillins Rash    Did it involve swelling of the face/tongue/throat, SOB, or low BP? Unknown Did it involve sudden or severe rash/hives, skin peeling, or any reaction on the inside of your mouth or nose? Unknown Did you need to seek medical attention at a hospital or doctor's office? Unknown When did it last happen? If all above answers are "NO", may proceed with cephalosporin use.     Antimicrobials this admission: 10/26 cefepime >> x1 10/26 flagyl >> 10/27 cipro >>  10/27 gentamicin >>  Dose adjustments this admission:  Microbiology results: 10/26 Covid: neg  Thank you for allowing pharmacy to be a part of this patient's care.  Minda Ditto 02/23/2019 11:38 AM

## 2019-02-23 NOTE — Progress Notes (Signed)
Pharmacy Antibiotic Note  Edward Ross is a 48 y.o. male admitted on 02/22/2019 with intra-abdominal infection.  Pharmacy has been consulted for gentamicin dosing.  Plan: Gentamicin 560 mg IV q24h Check 10 hour random vancomycin level as per [place] dosing nomogram F/u scr/cultures     Temp (24hrs), Avg:98.5 F (36.9 C), Min:98.4 F (36.9 C), Max:98.6 F (37 C)  Recent Labs  Lab 02/21/19 0156 02/21/19 0218 02/22/19 1539  WBC 15.3*  --  19.8*  CREATININE 1.24 1.10 1.16    Estimated Creatinine Clearance: 87.1 mL/min (by C-G formula based on SCr of 1.16 mg/dL).    Allergies  Allergen Reactions  . Penicillins Rash    Did it involve swelling of the face/tongue/throat, SOB, or low BP? Unknown Did it involve sudden or severe rash/hives, skin peeling, or any reaction on the inside of your mouth or nose? Unknown Did you need to seek medical attention at a hospital or doctor's office? Unknown When did it last happen? If all above answers are "NO", may proceed with cephalosporin use.     Antimicrobials this admission: 10/26 cefepime >> x1 10/27 cipro >>  10/26 flagyl >> 10/27 gentamicin >>  Dose adjustments this admission:   Microbiology results:  BCx:   UCx:    Sputum:    MRSA PCR:   Thank you for allowing pharmacy to be a part of this patient's care.  Dorrene German 02/23/2019 5:02 AM

## 2019-02-23 NOTE — Transfer of Care (Signed)
Immediate Anesthesia Transfer of Care Note  Patient: Edward Ross  Procedure(s) Performed: LAPAROSCOPIC ABDOMINAL EXPLORATION WITH REPAIR OF COLONIC PERFORATIONS (Abdomen) APPENDECTOMY LAPAROSCOPIC (Abdomen)  Patient Location: PACU  Anesthesia Type:General  Level of Consciousness: sedated  Airway & Oxygen Therapy: Patient Spontanous Breathing and Patient connected to face mask oxygen  Post-op Assessment: Report given to RN and Post -op Vital signs reviewed and stable  Post vital signs: Reviewed and stable  Last Vitals:  Vitals Value Taken Time  BP 135/66 02/23/19 0250  Temp    Pulse 107 02/23/19 0252  Resp 17 02/23/19 0252  SpO2 95 % 02/23/19 0252  Vitals shown include unvalidated device data.  Last Pain:  Vitals:   02/22/19 2228  TempSrc:   PainSc: 4          Complications: No apparent anesthesia complications

## 2019-02-23 NOTE — Op Note (Addendum)
Re:   Edward Ross DOB:   1970-10-30 MRN:   [id number]                   FACILITY:  [facility]  DATE OF PROCEDURE: 02/23/2019                              OPERATIVE REPORT  PREOPERATIVE DIAGNOSIS:  Pneumoperitoneum with probable perforation of right colon  POSTOPERATIVE DIAGNOSIS:  Focal perforation of right mid colon  PROCEDURE:  Laparoscopic exploration, oversew of perforated right colon, laparoscopic appendectomy.  SURGEON:  Sandria Bales. Ezzard Standing, MD  ASSISTANT:  No first assistant.  ANESTHESIA:  General endotracheal.  Anesthesiologist: Lewie Loron, MD CRNA: Minerva Ends, CRNA  ASA:  2E  ESTIMATED BLOOD LOSS:  75 cc  DRAINS: none   SPECIMEN:   Appendix  COUNTS CORRECT:  YES  INDICATIONS FOR PROCEDURE: Edward Ross is a 48 y.o. (DOB: 01-03-1971) white male whose primary care doctor is Johny Blamer, MD and comes to the OR for a laparoscopic exploration for probable colon perforation.   The patient underwent a colonoscopy on 02/19/2019 and developed abdominal pain by the following morning.  He now has peritoneal signs and pneumoperitoneum with the probable source centering around his right colon.  I discussed with the patient, the indications and potential complications of appendiceal surgery.  The potential complications include, but are not limited to, bleeding, open surgery, bowel resection, and the possibility of another diagnosis.  OPERATIVE NOTE:  The patient underwent a general endotracheal anesthetic as supervised by Anesthesiologist: Lewie Loron, MD CRNA: Minerva Ends, CRNA, General, in OR room #1.  The patient was given cefepime and flagyl prior to the beginning of the procedure and the abdomen was prepped with ChloraPrep.   A foley catheter was placed.  A time-out was held and surgical checklist run.  An infraumbilical incision was made with sharp dissection carried down to the abdominal cavity.  An 12 mm Hasson trocar was inserted through the  infraumbilical incision and into the peritoneal cavity.  A 30 degree 5 mm laparoscope was inserted through a 12 mm Hasson trocar and the Hasson trocar secured with a 0 Vicryl suture.  I placed a 5 mm trocar in the right upper quadrant and a 5 mm torcar in left lower quadrant and did abdominal exploration.    The right and left lobes of liver unremarkable.  Stomach was unremarkable.  The gall bladder was absent.  The patient had purulence in the right mid abdomen, over the right colon.  Small bowel had basically been trying to wall off the perforation.  After teasing the small bowel off the colon, I found a 4 mm hole in the right mid colon anteriorly.  I was able to throw 4 2-0 vicryl sutures across the defect and get back to what I thought is healthy colonic tissue.  Other than the acute inflammation, the colon looked okay.  The perforation was about 10 cm from the end of the cecum and from the base of the appendix.  But I was worried that if there was some inflammation post op, it could be confused for appendicitis, I removed his appendix.  I dissected to the base of the appendix with the harmonic scapel.  I then used a blue load 45 mm Ethicon Endo-GIA stapler and fired this across the base of the appendix.  Because the appendix was not inflamed, I was able to remove  it through the 12 mm trocar.   I irrigated the abdomen with 3,500 cc of saline and spent time trying to break up all the intra loop abscesses.   He also had purulence in the pelvis and over the right lobe of the liver.  After irrigating the abdomen, I then removed the trocars, in turn.  The umbilical port fascia was closed with 0 Vicryl suture.   I closed the skin each site with a 4-0 Monocryl suture and painted the wounds with DermaBond.  I then injected a total of 30 mL of 0.25% Marcaine at the incisions.  Sponge and needle count were correct at the end of the case.  The patient was transferred to the recovery room in good condition.  The  patient tolerated the procedure well and it depends on the patient's post op clinical course as to when the patient could be discharged.   Sutures that oversewed the right colon perforation.   Alphonsa Overall, MD, [facility] Surgery Pager: [phone] Office phone:  [phone]

## 2019-02-23 NOTE — Anesthesia Procedure Notes (Signed)
Procedure Name: Intubation Date/Time: 02/23/2019 12:31 AM Performed by: Cynda Familia, CRNA Pre-anesthesia Checklist: Patient identified, Emergency Drugs available, Suction available and Patient being monitored Patient Re-evaluated:Patient Re-evaluated prior to induction Oxygen Delivery Method: Circle System Utilized Preoxygenation: Pre-oxygenation with 100% oxygen Induction Type: IV induction, Cricoid Pressure applied and Rapid sequence Ventilation: Mask ventilation without difficulty Laryngoscope Size: Miller and 2 Grade View: Grade I Tube type: Oral Number of attempts: 1 Airway Equipment and Method: Stylet Placement Confirmation: ETT inserted through vocal cords under direct vision,  positive ETCO2 and breath sounds checked- equal and bilateral Secured at: 22 cm Tube secured with: Tape Dental Injury: Teeth and Oropharynx as per pre-operative assessment  Comments: Smooth IV induction by Lissa Hoard--  Intubation AM CRNA atraumatic-- teeth and mouth as preop-- bilat BS [place]

## 2019-02-23 NOTE — Anesthesia Procedure Notes (Signed)
Date/Time: 02/23/2019 2:42 AM Performed by: Cynda Familia, CRNA Oxygen Delivery Method: Simple face mask Placement Confirmation: positive ETCO2 and breath sounds checked- equal and bilateral Dental Injury: Teeth and Oropharynx as per pre-operative assessment

## 2019-02-23 NOTE — Progress Notes (Signed)
==   Social visit/no charge ==  -Patient seen and examined at bedside in the PACU.  -Appreciate surgical help.  Operative findings discussed with the patient.  Feeling much better.  Abdominal pain has improved since surgery.  Denies nausea vomiting.  Awaiting bed placement.  Otis Brace MD, [place] 02/23/2019, 10:22 AM  Contact #  [phone]

## 2019-02-24 LAB — CBC
HCT: 36.4 % — ABNORMAL LOW (ref 39.0–52.0)
Hemoglobin: 11.9 g/dL — ABNORMAL LOW (ref 13.0–17.0)
MCH: 30.5 pg (ref 26.0–34.0)
MCHC: 32.7 g/dL (ref 30.0–36.0)
MCV: 93.3 fL (ref 80.0–100.0)
Platelets: 203 10*3/uL (ref 150–400)
RBC: 3.9 MIL/uL — ABNORMAL LOW (ref 4.22–5.81)
RDW: 13.2 % (ref 11.5–15.5)
WBC: 13.1 10*3/uL — ABNORMAL HIGH (ref 4.0–10.5)
nRBC: 0 % (ref 0.0–0.2)

## 2019-02-24 LAB — SURGICAL PATHOLOGY

## 2019-02-24 MED ORDER — HYDROCORTISONE 1 % EX CREA: 1.0000 "application " | TOPICAL_CREAM | Freq: Three times a day (TID) | CUTANEOUS | Status: DC | PRN

## 2019-02-24 MED ORDER — ALUM & MAG HYDROXIDE-SIMETH 200-200-20 MG/5ML PO SUSP: 30.0000 mL | Freq: Four times a day (QID) | ORAL | Status: DC | PRN

## 2019-02-24 MED ORDER — PROCHLORPERAZINE EDISYLATE 10 MG/2ML IJ SOLN: 5.0000 mg | INTRAMUSCULAR | Status: DC | PRN

## 2019-02-24 MED ORDER — LIP MEDEX EX OINT
1.0000 "application " | TOPICAL_OINTMENT | Freq: Two times a day (BID) | CUTANEOUS | Status: DC
  Administered 2019-02-24 – 2019-02-26 (×4): 1 via TOPICAL
  Filled 2019-02-24: qty 7

## 2019-02-24 MED ORDER — MAGIC MOUTHWASH
15.0000 mL | Freq: Four times a day (QID) | ORAL | Status: DC | PRN
  Filled 2019-02-24: qty 15

## 2019-02-24 MED ORDER — PSYLLIUM 95 % PO PACK
1.0000 | PACK | Freq: Every day | ORAL | Status: DC
  Administered 2019-02-25 – 2019-02-26 (×2): 1 via ORAL
  Filled 2019-02-24 (×2): qty 1

## 2019-02-24 MED ORDER — SODIUM CHLORIDE 0.9% FLUSH
3.0000 mL | Freq: Two times a day (BID) | INTRAVENOUS | Status: DC
  Administered 2019-02-25: 3 mL via INTRAVENOUS

## 2019-02-24 MED ORDER — MENTHOL 3 MG MT LOZG: 1.0000 | LOZENGE | OROMUCOSAL | Status: DC | PRN

## 2019-02-24 MED ORDER — HYDROCODONE-ACETAMINOPHEN 7.5-325 MG PO TABS: 1.0000 | ORAL_TABLET | Freq: Four times a day (QID) | ORAL | Status: DC | PRN

## 2019-02-24 MED ORDER — SODIUM CHLORIDE 0.9% FLUSH: 3.0000 mL | INTRAVENOUS | Status: DC | PRN

## 2019-02-24 MED ORDER — SODIUM CHLORIDE 0.9 % IV SOLN
2.0000 g | INTRAVENOUS | Status: DC
  Administered 2019-02-24 – 2019-02-25 (×2): 2 g via INTRAVENOUS
  Filled 2019-02-24: qty 20
  Filled 2019-02-24 (×2): qty 2

## 2019-02-24 MED ORDER — HYDROCORTISONE (PERIANAL) 2.5 % EX CREA: 1.0000 "application " | TOPICAL_CREAM | Freq: Four times a day (QID) | CUTANEOUS | Status: DC | PRN

## 2019-02-24 MED ORDER — ACETAMINOPHEN 500 MG PO TABS
500.0000 mg | ORAL_TABLET | Freq: Four times a day (QID) | ORAL | Status: DC
  Administered 2019-02-24 – 2019-02-26 (×7): 500 mg via ORAL
  Filled 2019-02-24 (×7): qty 1

## 2019-02-24 MED ORDER — METHOCARBAMOL 1000 MG/10ML IJ SOLN
1000.0000 mg | Freq: Four times a day (QID) | INTRAVENOUS | Status: DC | PRN
  Filled 2019-02-24: qty 10

## 2019-02-24 MED ORDER — POTASSIUM CL IN DEXTROSE 5% 20 MEQ/L IV SOLN
20.0000 meq | INTRAVENOUS | Status: DC
  Administered 2019-02-24 – 2019-02-25 (×2): 20 meq via INTRAVENOUS
  Filled 2019-02-24 (×2): qty 1000

## 2019-02-24 MED ORDER — GUAIFENESIN-DM 100-10 MG/5ML PO SYRP: 10.0000 mL | ORAL_SOLUTION | ORAL | Status: DC | PRN

## 2019-02-24 MED ORDER — PHENOL 1.4 % MT LIQD: 1.0000 | OROMUCOSAL | Status: DC | PRN

## 2019-02-24 MED ORDER — SODIUM CHLORIDE 0.9 % IV SOLN: 250.0000 mL | INTRAVENOUS | Status: DC | PRN

## 2019-02-24 MED ORDER — DOCUSATE SODIUM 100 MG PO CAPS: 100.0000 mg | ORAL_CAPSULE | Freq: Two times a day (BID) | ORAL | Status: DC

## 2019-02-24 NOTE — Progress Notes (Addendum)
[facility] Progress Note  2 Days Post-Op  Subjective: CC-  Feeling better each day. Pain well controlled on tylenol and toradol. Denies abdominal bloating, nausea, vomiting. He has passed gas a few times. No BM. Ambulated in the hall this morning.  Objective: Vital signs in last 24 hours: Temp:  [98.1 F (36.7 C)-98.7 F (37.1 C)] 98.1 F (36.7 C) (10/28 0924) Pulse Rate:  [66-94] 66 (10/28 0924) Resp:  [14-18] 16 (10/28 0924) BP: (120-131)/(76-82) 127/82 (10/28 0924) SpO2:  [97 %-99 %] 99 % (10/28 0532) Weight:  [90.7 kg] 90.7 kg (10/28 0924)    Intake/Output from previous day: 10/27 0701 - 10/28 0700 In: 2549.2 [I.V.:1800; IV Piggyback:749.2] Out: 1950 [Urine:1950] Intake/Output this shift: No intake/output data recorded.  PE: Gen:  Alert, NAD, pleasant HEENT: EOM's intact, pupils equal and round Pulm:  Rate and effort normal Abd: soft, mild distension, few BS heard, incisions cdi without erythema or drainage, nontender Skin: warm and dry  Lab Results:  Recent Labs    02/23/19 0728 02/24/19 0331  WBC 17.9* 13.1*  HGB 13.5 11.9*  HCT 39.9 36.4*  PLT 212 203   BMET Recent Labs    02/22/19 1539 02/23/19 0728  NA 130* 137  K 3.7 3.8  CL 96* 105  CO2 23 23  GLUCOSE 140* 162*  BUN 12 10  CREATININE 1.16 0.91  CALCIUM 9.0 8.2*   PT/INR No results for input(s): LABPROT, INR in the last 72 hours. CMP     Component Value Date/Time   NA 137 02/23/2019 0728   K 3.8 02/23/2019 0728   CL 105 02/23/2019 0728   CO2 23 02/23/2019 0728   GLUCOSE 162 (H) 02/23/2019 0728   BUN 10 02/23/2019 0728   CREATININE 0.91 02/23/2019 0728   CALCIUM 8.2 (L) 02/23/2019 0728   PROT 6.2 (L) 02/23/2019 0728   ALBUMIN 2.9 (L) 02/23/2019 0728   AST 23 02/23/2019 0728   ALT 62 (H) 02/23/2019 0728   ALKPHOS 103 02/23/2019 0728   BILITOT 1.8 (H) 02/23/2019 0728   GFRNONAA >60 02/23/2019 0728   GFRAA >60 02/23/2019 0728   Lipase     Component Value Date/Time    LIPASE 20 02/22/2019 1539       Studies/Results: Ct Abdomen Pelvis W Contrast  Result Date: 02/22/2019 CLINICAL DATA:  Bowel perforation, subdiaphragmatic free air EXAM: CT ABDOMEN AND PELVIS WITH CONTRAST TECHNIQUE: Multidetector CT imaging of the abdomen and pelvis was performed using the standard protocol following bolus administration of intravenous contrast. CONTRAST:  128mL OMNIPAQUE IOHEXOL 300 MG/ML  SOLN COMPARISON:  Same day radiographs, CT abdomen pelvis 02/21/2019 FINDINGS: Lower chest: Basilar bandlike atelectasis and/or scarring. Lung bases are otherwise clear. Calcified granuloma in the right middle lobe. Normal heart size. No pericardial effusion. Hepatobiliary: No focal liver abnormality is seen. Patient is post cholecystectomy. No biliary ductal dilatation or calcified intraductal gallstones. Pancreas: Unremarkable. No pancreatic ductal dilatation or surrounding inflammatory changes. Spleen: Normal in size without focal abnormality. Adrenals/Urinary Tract: Normal adrenal glands. Stable 1 cm fluid attenuation cyst in the upper pole right kidney. Small linear area of cortical scarring in the left kidney. No suspicious renal lesions. No urolithiasis or hydronephrosis. Urinary bladder is unremarkable. Stomach/Bowel: Distal esophagus cm a more normal appearance on this exam. The stomach is unremarkable. Duodenal sweep is free of abnormality. Multiple air and fluid-filled loops of small bowel comprise much of the ileum. There is increasing distal ileal mural thickening and adjacent stranding and phlegmonous change predominantly  in the right lower quadrant. A small focus of extraluminal gas is seen adjacent the wall of the terminal ileum (2/71). Few distal ileal diverticula are noted (2/64). There is extensive mural thickening and hyperemia at the level of the cecum which appears displaced into the midline pelvis. Beyond the hepatic flexure, the colon assumes a more normal appearance.  Vascular/Lymphatic: The aorta is normal caliber. Scattered reactive mid abdominal and right lower quadrant mesenteric lymph nodes. No pathologically enlarged nodes. Reproductive: The prostate and seminal vesicles are unremarkable. Other: New free intraperitoneal air seen layering anterior to the liver. Increasing phlegmonous change in the anterior mesentery with some enhancement and thickening of the right lower quadrant anterior peritoneal surface compatible with peritonitis. Small volume of intermediate attenuation (20 HU) fluid in the right lower quadrant suspicious for succus. Musculoskeletal: Multilevel degenerative changes are present in the imaged portions of the spine. No acute osseous abnormality or suspicious osseous lesion. IMPRESSION: 1. New free intraperitoneal air layering anterior to the liver, compatible with a perforated viscus. Small volume of intermediate attenuation fluid in the right lower quadrant suspicious for succus. 2. Worsening distal ileal mural thickening and hyperemia with adjacent phlegmonous change. Small focus of extraluminal gas adjacent the wall of the terminal ileum, may suggest site of possible perforation. 3. Borderline air and fluid distention of the distal small bowel may reflect a developing ileus, less likely obstruction in the absence of discrete transition point. 4. Increased reactive thickening of the cecum is noted as well. 5. Thickening of the right lower quadrant anterior peritoneal surface compatible with peritonitis. Electronically Signed   By: Kreg ShropshirePrice  DeHay M.D.   On: 02/22/2019 22:22   Dg Abdomen Acute W/chest  Result Date: 02/22/2019 CLINICAL DATA:  Abdominal pain EXAM: DG ABDOMEN ACUTE W/ 1V CHEST COMPARISON:  CT from the previous day. FINDINGS: Cardiac shadow is within normal limits. The lungs are well aerated bilaterally. Mild left basilar atelectasis is noted. Multiple mildly dilated loops of small bowel are noted with differential air-fluid levels.  Additionally there is free air in the right upper abdomen consistent with perforation. This was not present on the CT from the previous day. No bony abnormality is noted. IMPRESSION: Changes are now noted consistent with interval perforation with free air beneath the right hemidiaphragm. Mild small bowel dilatation is seen which has increased in the interval from the prior exam likely related to the underlying distal ileal inflammatory change. Given the free air repeat CT may be helpful for further evaluation. These results will be called to the ordering clinician or representative by the Radiologist Assistant, and communication documented in the PACS or zVision Dashboard. Electronically Signed   By: Alcide CleverMark  Lukens M.D.   On: 02/22/2019 20:17    Anti-infectives: Anti-infectives (From admission, onward)   Start     Dose/Rate Route Frequency Ordered Stop   02/23/19 0600  ciprofloxacin (CIPRO) IVPB 400 mg     400 mg 200 mL/hr over 60 Minutes Intravenous Every 12 hours 02/23/19 0446     02/23/19 0530  gentamicin (GARAMYCIN) 560 mg in dextrose 5 % 100 mL IVPB     560 mg 114 mL/hr over 60 Minutes Intravenous Daily 02/23/19 0456     02/23/19 0500  metroNIDAZOLE (FLAGYL) IVPB 500 mg     500 mg 100 mL/hr over 60 Minutes Intravenous Every 8 hours 02/23/19 0446     02/22/19 2045  ceFEPIme (MAXIPIME) 2 g in sodium chloride 0.9 % 100 mL IVPB     2 g  200 mL/hr over 30 Minutes Intravenous  Once 02/22/19 2031 02/22/19 2141   02/22/19 2045  metroNIDAZOLE (FLAGYL) IVPB 500 mg     500 mg 100 mL/hr over 60 Minutes Intravenous  Once 02/22/19 2031 02/22/19 2309       Assessment/Plan Hx anal fissure Hx ankylosing spondylitis GERD ABL anemia - Hgb 11.9 from 13.5, recheck CBC in AM  Focal perforation of right mid colon s/p colonoscopy 02/19/19 Dr Marca Ancona S/p Laparoscopic exploration, oversew of perforated right colon, laparoscopicappendectomy, 02/22/19 Dr. Ovidio Kin - POD #2 - Passing flatus, no BM. Ileus  seems to be resolving - Advance to clear liquids. Mobilize. Add colace. He will need to complete 5 total days of antibiotics.  FEN:  decrease IVF, CLD ID:  Maxipime/Flagyl 10/26;  Cipro/flagyl/garamycin 10/27 >> 10/28, flagyl/ceftriaxone 10/28>>  DVT: SCD's, lovenox Foley: out Follow up:  Dr. Ezzard Standing   LOS: 1 day    Franne Forts, [facility] Surgery 02/24/2019, 10:00 AM Please see Amion for pager number during day hours 7:00am-4:30pm

## 2019-02-24 NOTE — Plan of Care (Signed)
Patient lying in bed this morning; pain controlled at this time. No other concerns noted at this time. Will continue to monitor.

## 2019-02-24 NOTE — Progress Notes (Signed)
Pharmacy Antibiotic Note  Edward Ross is a 48 y.o. male admitted on 02/22/2019 with intra-abdominal infection.  He is s/p lap appendectomy and oversew of perforated R colon.  Pharmacy has been consulted for gentamicin dosing.   02/24/2019:  10h Gent level 10/27 @ 1604= 1.5  Afebrile  Scr stable  WBC decreasing   Plan: Continue Gentamicin 560 mg IV q24h Cipro 400mg  IV q12 Flagyl 500mg  IV q8 Noted plan for 5 days of antibiotics post-op Recheck gent level if change in renal function or prolonged LOT >7d     Temp (24hrs), Avg:98.5 F (36.9 C), Min:98.1 F (36.7 C), Max:99.1 F (37.3 C)  Recent Labs  Lab 02/21/19 0156 02/21/19 0218 02/22/19 1539 02/23/19 0728 02/23/19 1604 02/24/19 0331  WBC 15.3*  --  19.8* 17.9*  --  13.1*  CREATININE 1.24 1.10 1.16 0.91  --   --   GENTRANDOM  --   --   --   --  1.5  --     Estimated Creatinine Clearance: 111 mL/min (by C-G formula based on SCr of 0.91 mg/dL).    Allergies  Allergen Reactions  . Penicillins Rash    Did it involve swelling of the face/tongue/throat, SOB, or low BP? Unknown Did it involve sudden or severe rash/hives, skin peeling, or any reaction on the inside of your mouth or nose? Unknown Did you need to seek medical attention at a hospital or doctor's office? Unknown When did it last happen? If all above answers are "NO", may proceed with cephalosporin use.     Antimicrobials this admission: 10/26 cefepime >> x1 10/26 flagyl >> 10/27 cipro >>  10/27 gentamicin >>  Dose adjustments this admission:  Microbiology results: 10/26 Covid: neg  Thank you for allowing pharmacy to be a part of this patient's care.  Netta Cedars, PharmD, BCPS 02/24/2019 7:32 AM

## 2019-02-25 LAB — BASIC METABOLIC PANEL
Anion gap: 9 (ref 5–15)
BUN: 13 mg/dL (ref 6–20)
CO2: 23 mmol/L (ref 22–32)
Calcium: 8 mg/dL — ABNORMAL LOW (ref 8.9–10.3)
Chloride: 105 mmol/L (ref 98–111)
Creatinine, Ser: 1.08 mg/dL (ref 0.61–1.24)
GFR calc Af Amer: >60 mL/min (ref >60)
GFR calc non Af Amer: >60 mL/min (ref >60)
Glucose, Bld: 117 mg/dL — ABNORMAL HIGH (ref 70–99)
Potassium: 3.7 mmol/L (ref 3.5–5.1)
Sodium: 137 mmol/L (ref 135–145)

## 2019-02-25 LAB — HEPATIC FUNCTION PANEL
ALT: 40 U/L (ref 0–44)
AST: 18 U/L (ref 15–41)
Albumin: 2.7 g/dL — ABNORMAL LOW (ref 3.5–5.0)
Alkaline Phosphatase: 121 U/L (ref 38–126)
Bilirubin, Direct: 0.3 mg/dL — ABNORMAL HIGH (ref 0.0–0.2)
Indirect Bilirubin: 0.5 mg/dL (ref 0.3–0.9)
Total Bilirubin: 0.8 mg/dL (ref 0.3–1.2)
Total Protein: 5.6 g/dL — ABNORMAL LOW (ref 6.5–8.1)

## 2019-02-25 LAB — CBC
HCT: 39.1 % (ref 39.0–52.0)
Hemoglobin: 12.8 g/dL — ABNORMAL LOW (ref 13.0–17.0)
MCH: 30.5 pg (ref 26.0–34.0)
MCHC: 32.7 g/dL (ref 30.0–36.0)
MCV: 93.1 fL (ref 80.0–100.0)
Platelets: 225 10*3/uL (ref 150–400)
RBC: 4.2 MIL/uL — ABNORMAL LOW (ref 4.22–5.81)
RDW: 13.4 % (ref 11.5–15.5)
WBC: 9.4 10*3/uL (ref 4.0–10.5)
nRBC: 0 % (ref 0.0–0.2)

## 2019-02-25 MED ORDER — POLYETHYLENE GLYCOL 3350 17 G PO PACK
17.0000 g | PACK | Freq: Every day | ORAL | Status: DC
  Administered 2019-02-26: 17 g via ORAL
  Filled 2019-02-25: qty 1

## 2019-02-25 MED ORDER — FAMOTIDINE 20 MG PO TABS
20.0000 mg | ORAL_TABLET | Freq: Two times a day (BID) | ORAL | Status: DC
  Administered 2019-02-25: 20 mg via ORAL
  Filled 2019-02-25: qty 1

## 2019-02-25 MED ORDER — MORPHINE SULFATE (PF) 4 MG/ML IV SOLN: 1.0000 mg | INTRAVENOUS | Status: DC | PRN

## 2019-02-25 MED ORDER — PANTOPRAZOLE SODIUM 40 MG PO TBEC
40.0000 mg | DELAYED_RELEASE_TABLET | Freq: Every day | ORAL | Status: DC
  Administered 2019-02-25: 40 mg via ORAL
  Filled 2019-02-25: qty 1

## 2019-02-25 NOTE — Progress Notes (Signed)
[facility] Progress Note  3 Days Post-Op  Subjective: CC-  Up in chair. States that he feels better than yesterday. Tolerated full liquids for breakfast. Still has some abdominal bloating and soreness, but denies n/v. Passing flatus. No BM. Ambulating frequently in the halls.  Objective: Vital signs in last 24 hours: Temp:  [98.6 F (37 C)-99.6 F (37.6 C)] 98.6 F (37 C) (10/29 0539) Pulse Rate:  [79-86] 86 (10/29 0539) Resp:  [15-20] 15 (10/29 0539) BP: (130-146)/(80-95) 146/95 (10/29 0539) SpO2:  [95 %-96 %] 96 % (10/29 0539) Last BM Date: 02/19/19  Intake/Output from previous day: 10/28 0701 - 10/29 0700 In: 2235.7 [P.O.:480; I.V.:1412.4; IV Piggyback:343.3] Out: 4000 [Urine:4000] Intake/Output this shift: Total I/O In: 640 [P.O.:640] Out: -   PE: Gen:  Alert, NAD, pleasant HEENT: EOM's intact, pupils equal and round Pulm:  Rate and effort normal Abd: soft, mild distension, few BS heard, incisions cdi without erythema or drainage, nontender Skin: warm and dry   Lab Results:  Recent Labs    02/24/19 0331 02/25/19 0338  WBC 13.1* 9.4  HGB 11.9* 12.8*  HCT 36.4* 39.1  PLT 203 225   BMET Recent Labs    02/23/19 0728 02/25/19 0338  NA 137 137  K 3.8 3.7  CL 105 105  CO2 23 23  GLUCOSE 162* 117*  BUN 10 13  CREATININE 0.91 1.08  CALCIUM 8.2* 8.0*   PT/INR No results for input(s): LABPROT, INR in the last 72 hours. CMP     Component Value Date/Time   NA 137 02/25/2019 0338   K 3.7 02/25/2019 0338   CL 105 02/25/2019 0338   CO2 23 02/25/2019 0338   GLUCOSE 117 (H) 02/25/2019 0338   BUN 13 02/25/2019 0338   CREATININE 1.08 02/25/2019 0338   CALCIUM 8.0 (L) 02/25/2019 0338   PROT 5.6 (L) 02/25/2019 0338   ALBUMIN 2.7 (L) 02/25/2019 0338   AST 18 02/25/2019 0338   ALT 40 02/25/2019 0338   ALKPHOS 121 02/25/2019 0338   BILITOT 0.8 02/25/2019 0338   GFRNONAA >60 02/25/2019 0338   GFRAA >60 02/25/2019 0338   Lipase     Component  Value Date/Time   LIPASE 20 02/22/2019 1539       Studies/Results: No results found.  Anti-infectives: Anti-infectives (From admission, onward)   Start     Dose/Rate Route Frequency Ordered Stop   02/24/19 1400  cefTRIAXone (ROCEPHIN) 2 g in sodium chloride 0.9 % 100 mL IVPB     2 g 200 mL/hr over 30 Minutes Intravenous Every 24 hours 02/24/19 1100     02/23/19 0600  ciprofloxacin (CIPRO) IVPB 400 mg  Status:  Discontinued     400 mg 200 mL/hr over 60 Minutes Intravenous Every 12 hours 02/23/19 0446 02/24/19 1100   02/23/19 0530  gentamicin (GARAMYCIN) 560 mg in dextrose 5 % 100 mL IVPB  Status:  Discontinued     560 mg 114 mL/hr over 60 Minutes Intravenous Daily 02/23/19 0456 02/24/19 1100   02/23/19 0500  metroNIDAZOLE (FLAGYL) IVPB 500 mg     500 mg 100 mL/hr over 60 Minutes Intravenous Every 8 hours 02/23/19 0446     02/22/19 2045  ceFEPIme (MAXIPIME) 2 g in sodium chloride 0.9 % 100 mL IVPB     2 g 200 mL/hr over 30 Minutes Intravenous  Once 02/22/19 2031 02/22/19 2141   02/22/19 2045  metroNIDAZOLE (FLAGYL) IVPB 500 mg     500 mg 100 mL/hr over 60  Minutes Intravenous  Once 02/22/19 2031 02/22/19 2309       Assessment/Plan Hx anal fissure Hx ankylosing spondylitis GERD ABL anemia - Hgb 12.8 from 11.9, stable  Focal perforation of right mid colon s/p colonoscopy 02/19/19 Dr Marca Ancona S/p Laparoscopic exploration, oversew of perforated right colon, laparoscopicappendectomy, 02/22/19 Dr. Ovidio Kin - POD #3 - Passing flatus and tolerating full liquids but has not had a bowel movement yet. Ileus seems to be resolving. Advance to soft diet. Continue mobilizing. Possibly home tomorrow but would like for him to have a bowel movement first.  FEN: d/c IVF, soft diet ID: Maxipime/Flagyl 10/26; Cipro/flagyl/garamycin 10/27 >> 10/28, flagyl/ceftriaxone 10/28>>  (he will need 5 days of antibiotics postop) DVT: SCD's, lovenox Foley: out Follow up: Dr. Ezzard Standing    LOS: 2  days    Franne Forts, [facility] 02/25/2019, 11:43 AM Please see Amion for pager number during day hours 7:00am-4:30pm

## 2019-02-25 NOTE — Progress Notes (Signed)
The patient is receiving Pepcid and Protonix by the intravenous route.  Based on criteria approved by the Pharmacy and [place], the medication is being converted to the equivalent oral dose form.  These criteria include: -No active GI bleeding -Able to tolerate diet of full liquids (or better) or tube feeding -Able to tolerate other medications by the oral or enteral route  If you have any questions about this conversion, please contact the Pharmacy Department (phone [phone]  Thank you.  Minda Ditto PharmD 02/25/2019, 9:11 AM

## 2019-02-25 NOTE — Progress Notes (Signed)
==   no charge ==  -Patient seen and examined at bedside  -Complaining of mild abdominal distention as well as mild right lower quadrant discomfort.  Passing gas but no bowel movement today.  Denies nausea and vomiting.  Complaining of dark urine.  Recommendations --------------------------- -Repeat LFTs today. -DC famotidine -Add MiraLAX -Diet advancement per surgery.  Otis Brace MD, [place] 02/25/2019, 10:23 AM  Contact #  [phone]

## 2019-02-26 MED ORDER — POLYETHYLENE GLYCOL 3350 17 G PO PACK: 17.0000 g | PACK | Freq: Every day | ORAL | 0 refills | Status: AC

## 2019-02-26 MED ORDER — ACETAMINOPHEN 500 MG PO TABS: 500.0000 mg | ORAL_TABLET | Freq: Four times a day (QID) | ORAL | 0 refills | Status: AC | PRN

## 2019-02-26 MED ORDER — CEFDINIR 300 MG PO CAPS: 300.0000 mg | ORAL_CAPSULE | Freq: Two times a day (BID) | ORAL | 0 refills | Status: AC

## 2019-02-26 MED ORDER — METRONIDAZOLE 500 MG PO TABS: 500.0000 mg | ORAL_TABLET | Freq: Three times a day (TID) | ORAL | 0 refills | Status: AC

## 2019-02-26 NOTE — Progress Notes (Signed)
==   no charge ==  -Patient seen and examined at bedside  -Feeling better from GI standpoint.  Had one bowel movement today.  Complaining of nausea but denies any vomiting.    Recommendations --------------------------- -Continue MiraLAX as needed -Diet advancement per surgery -GI will sign off.  Call us back if needed  Otis Brace MD, [place] 02/26/2019, 9:12 AM  Contact #  [phone]

## 2019-02-26 NOTE — Discharge Instructions (Addendum)
SURGERY: POST OP INSTRUCTIONS °(Surgery for small bowel obstruction, colon resection, etc) ° ° °###################################################################### ° °EAT °Gradually transition to a high fiber diet with a fiber supplement over the next few days after discharge ° °WALK °Walk an hour a day.  Control your pain to do that.   ° °CONTROL PAIN °Control pain so that you can walk, sleep, tolerate sneezing/coughing, go up/down stairs. ° °HAVE A BOWEL MOVEMENT DAILY °Keep your bowels regular to avoid problems.  OK to try a laxative to override constipation.  OK to use an antidairrheal to slow down diarrhea.  Call if not better after 2 tries ° °CALL IF YOU HAVE PROBLEMS/CONCERNS °Call if you are still struggling despite following these instructions. °Call if you have concerns not answered by these instructions ° °###################################################################### ° ° °DIET °Follow a light diet the first few days at home.  Start with a bland diet such as soups, liquids, starchy foods, low fat foods, etc.  If you feel full, bloated, or constipated, stay on a ful liquid or pureed/blenderized diet for a few days until you feel better and no longer constipated. °Be sure to drink plenty of fluids every day to avoid getting dehydrated (feeling dizzy, not urinating, etc.). °Gradually add a fiber supplement to your diet over the next week.  Gradually get back to a regular solid diet.  Avoid fast food or heavy meals the first week as you are more likely to get nauseated. °It is expected for your digestive tract to need a few months to get back to normal.  It is common for your bowel movements and stools to be irregular.  You will have occasional bloating and cramping that should eventually fade away.  Until you are eating solid food normally, off all pain medications, and back to regular activities; your bowels will not be normal. °Focus on eating a low-fat, high fiber diet the rest of your life  (See Getting to Good Bowel Health, below). ° °CARE of your INCISION or WOUND °It is good for closed incision and even open wounds to be washed every day.  Shower every day.  Short baths are fine.  Wash the incisions and wounds clean with soap & water.    °If you have a closed incision(s), wash the incision with soap & water every day.  You may leave closed incisions open to air if it is dry.   You may cover the incision with clean gauze & replace it after your daily shower for comfort. °If you have skin tapes (Steristrips) or skin glue (Dermabond) on your incision, leave them in place.  They will fall off on their own like a scab.  You may trim any edges that curl up with clean scissors.  If you have staples, set up an appointment for them to be removed in the office in 10 days after surgery.  °If you have a drain, wash around the skin exit site with soap & water and place a new dressing of gauze or band aid around the skin every day.  Keep the drain site clean & dry.    °If you have an open wound with packing, see wound care instructions.  In general, it is encouraged that you remove your dressing and packing, shower with soap & water, and replace your dressing once a day.  Pack the wound with clean gauze moistened with normal (0.9%) saline to keep the wound moist & uninfected.  Pressure on the dressing for 30 minutes will stop most wound   bleeding.  Eventually your body will heal & pull the open wound closed over the next few months.  °Raw open wounds will occasionally bleed or secrete yellow drainage until it heals closed.  Drain sites will drain a little until the drain is removed.  Even closed incisions can have mild bleeding or drainage the first few days until the skin edges scab over & seal.   °If you have an open wound with a wound vac, see wound vac care instructions. ° ° ° ° °ACTIVITIES as tolerated °Start light daily activities --- self-care, walking, climbing stairs-- beginning the day after surgery.   Gradually increase activities as tolerated.  Control your pain to be active.  Stop when you are tired.  Ideally, walk several times a day, eventually an hour a day.   °Most people are back to most day-to-day activities in a few weeks.  It takes 4-8 weeks to get back to unrestricted, intense activity. °If you can walk 30 minutes without difficulty, it is safe to try more intense activity such as jogging, treadmill, bicycling, low-impact aerobics, swimming, etc. °Save the most intensive and strenuous activity for last (Usually 4-8 weeks after surgery) such as sit-ups, heavy lifting, contact sports, etc.  Refrain from any intense heavy lifting or straining until you are off narcotics for pain control.  You will have off days, but things should improve week-by-week. °DO NOT PUSH THROUGH PAIN.  Let pain be your guide: If it hurts to do something, don't do it.  Pain is your body warning you to avoid that activity for another week until the pain goes down. °You may drive when you are no longer taking narcotic prescription pain medication, you can comfortably wear a seatbelt, and you can safely make sudden turns/stops to protect yourself without hesitating due to pain. °You may have sexual intercourse when it is comfortable. If it hurts to do something, stop. ° °MEDICATIONS °Take your usually prescribed home medications unless otherwise directed.   °Blood thinners:  °Usually you can restart any strong blood thinners after the second postoperative day.  It is OK to take aspirin right away.    ° If you are on strong blood thinners (warfarin/Coumadin, Plavix, Xerelto, Eliquis, Pradaxa, etc), discuss with your surgeon, medicine PCP, and/or cardiologist for instructions on when to restart the blood thinner & if blood monitoring is needed (PT/INR blood check, etc).   ° ° °PAIN CONTROL °Pain after surgery or related to activity is often due to strain/injury to muscle, tendon, nerves and/or incisions.  This pain is usually  short-term and will improve in a few months.  °To help speed the process of healing and to get back to regular activity more quickly, DO THE FOLLOWING THINGS TOGETHER: °1. Increase activity gradually.  DO NOT PUSH THROUGH PAIN °2. Use Ice and/or Heat °3. Try Gentle Massage and/or Stretching °4. Take over the counter pain medication °5. Take Narcotic prescription pain medication for more severe pain ° °Good pain control = faster recovery.  It is better to take more medicine to be more active than to stay in bed all day to avoid medications. °1.  Increase activity gradually °Avoid heavy lifting at first, then increase to lifting as tolerated over the next 6 weeks. °Do not “push through” the pain.  Listen to your body and avoid positions and maneuvers than reproduce the pain.  Wait a few days before trying something more intense °Walking an hour a day is encouraged to help your body recover faster   and more safely.  Start slowly and stop when getting sore.  If you can walk 30 minutes without stopping or pain, you can try more intense activity (running, jogging, aerobics, cycling, swimming, treadmill, sex, sports, weightlifting, etc.) °Remember: If it hurts to do it, then don’t do it! °2. Use Ice and/or Heat °You will have swelling and bruising around the incisions.  This will take several weeks to resolve. °Ice packs or heating pads (6-8 times a day, 30-60 minutes at a time) will help sooth soreness & bruising. °Some people prefer to use ice alone, heat alone, or alternate between ice & heat.  Experiment and see what works best for you.  Consider trying ice for the first few days to help decrease swelling and bruising; then, switch to heat to help relax sore spots and speed recovery. °Shower every day.  Short baths are fine.  It feels good!  Keep the incisions and wounds clean with soap & water.   °3. Try Gentle Massage and/or Stretching °Massage at the area of pain many times a day °Stop if you feel pain - do not  overdo it °4. Take over the counter pain medication °This helps the muscle and nerve tissues become less irritable and calm down faster °Choose ONE of the following over-the-counter anti-inflammatory medications: °Acetaminophen 500mg tabs (Tylenol) 1-2 pills with every meal and just before bedtime (avoid if you have liver problems or if you have acetaminophen in you narcotic prescription) °Naproxen 220mg tabs (ex. Aleve, Naprosyn) 1-2 pills twice a day (avoid if you have kidney, stomach, IBD, or bleeding problems) °Ibuprofen 200mg tabs (ex. Advil, Motrin) 3-4 pills with every meal and just before bedtime (avoid if you have kidney, stomach, IBD, or bleeding problems) °Take with food/snack several times a day as directed for at least 2 weeks to help keep pain / soreness down & more manageable. °5. Take Narcotic prescription pain medication for more severe pain °A prescription for strong pain control is often given to you upon discharge (for example: oxycodone/Percocet, hydrocodone/Norco/Vicodin, or tramadol/Ultram) °Take your pain medication as prescribed. °Be mindful that most narcotic prescriptions contain Tylenol (acetaminophen) as well - avoid taking too much Tylenol. °If you are having problems/concerns with the prescription medicine (does not control pain, nausea, vomiting, rash, itching, etc.), please call us [phone] to see if we need to switch you to a different pain medicine that will work better for you and/or control your side effects better. °If you need a refill on your pain medication, you must call the office before 4 pm and on weekdays only.  By federal law, prescriptions for narcotics cannot be called into a pharmacy.  They must be filled out on paper & picked up from our office by the patient or authorized caretaker.  Prescriptions cannot be filled after 4 pm nor on weekends.   ° °WHEN TO CALL US [phone] °Severe uncontrolled or worsening pain  °Fever over 101 F (38.5 C) °Concerns with  the incision: Worsening pain, redness, rash/hives, swelling, bleeding, or drainage °Reactions / problems with new medications (itching, rash, hives, nausea, etc.) °Nausea and/or vomiting °Difficulty urinating °Difficulty breathing °Worsening fatigue, dizziness, lightheadedness, blurred vision °Other concerns °If you are not getting better after two weeks or are noticing you are getting worse, contact our office [phone] for further advice.  We may need to adjust your medications, re-evaluate you in the office, send you to the emergency room, or see what other things we can do to help. °The   clinic staff is available to answer your questions during regular business hours (8:30am-5pm).  Please don’t hesitate to call and ask to speak to one of our nurses for clinical concerns.    °A surgeon from [facility] is always on call at the hospitals 24 hours/day °If you have a medical emergency, go to the nearest emergency room or call 911. ° °FOLLOW UP in our office °One the day of your discharge from the hospital (or the next business weekday), please call [facility] to set up or confirm an appointment to see your surgeon in the office for a follow-up appointment.  Usually it is 2-3 weeks after your surgery.   °If you have skin staples at your incision(s), let the office know so we can set up a time in the office for the nurse to remove them (usually around 10 days after surgery). °Make sure that you call for appointments the day of discharge (or the next business weekday) from the hospital to ensure a convenient appointment time. °IF YOU HAVE DISABILITY OR FAMILY LEAVE FORMS, BRING THEM TO THE OFFICE FOR PROCESSING.  DO NOT GIVE THEM TO YOUR DOCTOR. ° °[facility] °[address], [address], [place], [place]  [address] ? °[phone] - Main °[phone] - Toll Free,  [phone] - Fax °[email or web address] ° °GETTING TO GOOD BOWEL HEALTH. °It is  expected for your digestive tract to need a few months to get back to normal.  It is common for your bowel movements and stools to be irregular.  You will have occasional bloating and cramping that should eventually fade away.  Until you are eating solid food normally, off all pain medications, and back to regular activities; your bowels will not be normal.   °Avoiding constipation °The goal: ONE SOFT BOWEL MOVEMENT A DAY!    °Drink plenty of fluids.  Choose water first. °TAKE A FIBER SUPPLEMENT EVERY DAY THE REST OF YOUR LIFE °During your first week back home, gradually add back a fiber supplement every day °Experiment which form you can tolerate.   There are many forms such as powders, tablets, wafers, gummies, etc °Psyllium bran (Metamucil), methylcellulose (Citrucel), Miralax or Glycolax, Benefiber, Flax Seed.  °Adjust the dose week-by-week (1/2 dose/day to 6 doses a day) until you are moving your bowels 1-2 times a day.  Cut back the dose or try a different fiber product if it is giving you problems such as diarrhea or bloating. °Sometimes a laxative is needed to help jump-start bowels if constipated until the fiber supplement can help regulate your bowels.  If you are tolerating eating & you are farting, it is okay to try a gentle laxative such as double dose MiraLax, prune juice, or Milk of Magnesia.  Avoid using laxatives too often. °Stool softeners can sometimes help counteract the constipating effects of narcotic pain medicines.  It can also cause diarrhea, so avoid using for too long. °If you are still constipated despite taking fiber daily, eating solids, and a few doses of laxatives, call our office. °Controlling diarrhea °Try drinking liquids and eating bland foods for a few days to avoid stressing your intestines further. °Avoid dairy products (especially milk & ice cream) for a short time.  The intestines often can lose the ability to digest lactose when stressed. °Avoid foods that cause gassiness or  bloating.  Typical foods include beans and other legumes, cabbage, broccoli, and dairy foods.  Avoid greasy, spicy, fast foods.  Every person has   some sensitivity to other foods, so listen to your body and avoid those foods that trigger problems for you. °Probiotics (such as active yogurt, Align, etc) may help repopulate the intestines and colon with normal bacteria and calm down a sensitive digestive tract °Adding a fiber supplement gradually can help thicken stools by absorbing excess fluid and retrain the intestines to act more normally.  Slowly increase the dose over a few weeks.  Too much fiber too soon can backfire and cause cramping & bloating. °It is okay to try and slow down diarrhea with a few doses of antidiarrheal medicines.   °Bismuth subsalicylate (ex. Kayopectate, Pepto Bismol) for a few doses can help control diarrhea.  Avoid if pregnant.   °Loperamide (Imodium) can slow down diarrhea.  Start with one tablet (2mg) first.  Avoid if you are having fevers or severe pain.  °ILEOSTOMY PATIENTS WILL HAVE CHRONIC DIARRHEA since their colon is not in use.    °Drink plenty of liquids.  You will need to drink even more glasses of water/liquid a day to avoid getting dehydrated. °Record output from your ileostomy.  Expect to empty the bag every 3-4 hours at first.  Most people with a permanent ileostomy empty their bag 4-6 times at the least.   °Use antidiarrheal medicine (especially Imodium) several times a day to avoid getting dehydrated.  Start with a dose at bedtime & breakfast.  Adjust up or down as needed.  Increase antidiarrheal medications as directed to avoid emptying the bag more than 8 times a day (every 3 hours). °Work with your wound ostomy nurse to learn care for your ostomy.  See ostomy care instructions. °TROUBLESHOOTING IRREGULAR BOWELS °1) Start with a soft & bland diet. No spicy, greasy, or fried foods.  °2) Avoid gluten/wheat or dairy products from diet to see if symptoms improve. °3) Miralax  17gm or flax seed mixed in 8oz. water or juice-daily. May use 2-4 times a day as needed. °4) Gas-X, Phazyme, etc. as needed for gas & bloating.  °5) Prilosec (omeprazole) over-the-counter as needed °6)  Consider probiotics (Align, Activa, etc) to help calm the bowels down ° °Call your doctor if you are getting worse or not getting better.  Sometimes further testing (cultures, endoscopy, X-ray studies, CT scans, bloodwork, etc.) may be needed to help diagnose and treat the cause of the diarrhea. °[facility] °[address], [address], [place], [place]  [address] °[phone] - Main.    °[phone]  - Toll Free.   [phone] - Fax °[email or web address] ° ° °GETTING TO GOOD BOWEL HEALTH. ° °###################################################################### ° °EAT °Gradually transition to a high fiber diet with a fiber supplement over the next few weeks after discharge.  Start with a pureed / full liquid diet (see below) ° °WALK °Walk an hour a day.  Control your pain to do that.   ° °HAVE A BOWEL MOVEMENT DAILY °Keep your bowels regular to avoid problems.  OK to try a laxative to override constipation.  OK to use an antidairrheal to slow down diarrhea.  Call if not better after 2 tries ° °CALL IF YOU HAVE PROBLEMS/CONCERNS °Call if you are still struggling despite following these instructions. °Call if you have concerns not answered by these instructions ° °###################################################################### ° ° °Irregular bowel habits such as constipation and diarrhea can lead to many problems over time.  Having one soft bowel movement a day is the most important way to prevent further problems.  The anorectal canal is   designed to handle stretching and feces to safely manage our ability to get rid of solid waste (feces, poop, stool) out of our body.  BUT, hard constipated stools can act like ripping concrete bricks and diarrhea can be a burning fire to  this very sensitive area of our body, causing inflamed hemorrhoids, anal fissures, increasing risk is perirectal abscesses, abdominal pain/bloating, an making irritable bowel worse.     ° °The goal: ONE SOFT BOWEL MOVEMENT A DAY!  To have soft, regular bowel movements:  °• Drink plenty of fluids, consider 4-6 tall glasses of water a day.   °• Take plenty of fiber.  Fiber is the undigested part of plant food that passes into the colon, acting s “natures broom” to encourage bowel motility and movement.  Fiber can absorb and hold large amounts of water. This results in a larger, bulkier stool, which is soft and easier to pass. Work gradually over several weeks up to 6 servings a day of fiber (25g a day even more if needed) in the form of: °o Vegetables -- Root (potatoes, carrots, turnips), leafy green (lettuce, salad greens, celery, spinach), or cooked high residue (cabbage, broccoli, etc) °o Fruit -- Fresh (unpeeled skin & pulp), Dried (prunes, apricots, cherries, etc ),  or stewed ( applesauce)  °o Whole grain breads, pasta, etc (whole wheat)  °o Bran cereals  °• Bulking Agents -- This type of water-retaining fiber generally is easily obtained each day by one of the following:  °o Psyllium bran -- The psyllium plant is remarkable because its ground seeds can retain so much water. This product is available as Metamucil, Konsyl, Effersyllium, Per Diem Fiber, or the less expensive generic preparation in drug and health food stores. Although labeled a laxative, it really is not a laxative.  °o Methylcellulose -- This is another fiber derived from wood which also retains water. It is available as Citrucel. °o Polyethylene Glycol - and “artificial” fiber commonly called Miralax or Glycolax.  It is helpful for people with gassy or bloated feelings with regular fiber °o Flax Seed - a less gassy fiber than psyllium °• No reading or other relaxing activity while on the toilet. If bowel movements take longer than 5 minutes, you  are too constipated °• AVOID CONSTIPATION.  High fiber and water intake usually takes care of this.  Sometimes a laxative is needed to stimulate more frequent bowel movements, but  °• Laxatives are not a good long-term solution as it can wear the colon out.  They can help jump-start bowels if constipated, but should be relied on constantly without discussing with your doctor °o Osmotics (Milk of Magnesia, Fleets phosphosoda, Magnesium citrate, MiraLax, GoLytely) are safer than  °o Stimulants (Senokot, Castor Oil, Dulcolax, Ex Lax)    °o Avoid taking laxatives for more than 7 days in a row. °•  IF SEVERELY CONSTIPATED, try a Bowel Retraining Program: °o Do not use laxatives.  °o Eat a diet high in roughage, such as bran cereals and leafy vegetables.  °o Drink six (6) ounces of prune or apricot juice each morning.  °o Eat two (2) large servings of stewed fruit each day.  °o Take one (1) heaping tablespoon of a psyllium-based bulking agent twice a day. Use sugar-free sweetener when possible to avoid excessive calories.  °o Eat a normal breakfast.  °o Set aside 15 minutes after breakfast to sit on the toilet, but do not strain to have a bowel movement.  °o If you do not   have a bowel movement by the third day, use an enema and repeat the above steps.  °• Controlling diarrhea °o Switch to liquids and simpler foods for a few days to avoid stressing your intestines further. °o Avoid dairy products (especially milk & ice cream) for a short time.  The intestines often can lose the ability to digest lactose when stressed. °o Avoid foods that cause gassiness or bloating.  Typical foods include beans and other legumes, cabbage, broccoli, and dairy foods.  Every person has some sensitivity to other foods, so listen to our body and avoid those foods that trigger problems for you. °o Adding fiber (Citrucel, Metamucil, psyllium, Miralax) gradually can help thicken stools by absorbing excess fluid and retrain the intestines to act  more normally.  Slowly increase the dose over a few weeks.  Too much fiber too soon can backfire and cause cramping & bloating. °o Probiotics (such as active yogurt, Align, etc) may help repopulate the intestines and colon with normal bacteria and calm down a sensitive digestive tract.  Most studies show it to be of mild help, though, and such products can be costly. °o Medicines: °- Bismuth subsalicylate (ex. Kayopectate, Pepto Bismol) every 30 minutes for up to 6 doses can help control diarrhea.  Avoid if pregnant. °- Loperamide (Immodium) can slow down diarrhea.  Start with two tablets (4mg total) first and then try one tablet every 6 hours.  Avoid if you are having fevers or severe pain.  If you are not better or start feeling worse, stop all medicines and call your doctor for advice °o Call your doctor if you are getting worse or not better.  Sometimes further testing (cultures, endoscopy, X-ray studies, bloodwork, etc) may be needed to help diagnose and treat the cause of the diarrhea. ° °TROUBLESHOOTING IRREGULAR BOWELS °1) Avoid extremes of bowel movements (no bad constipation/diarrhea) °2) Miralax 17gm mixed in 8oz. water or juice-daily. May use BID as needed.  °3) Gas-x,Phazyme, etc. as needed for gas & bloating.  °4) Soft,bland diet. No spicy,greasy,fried foods.  °5) Prilosec over-the-counter as needed  °6) May hold gluten/wheat products from diet to see if symptoms improve.  °7)  May try probiotics (Align, Activa, etc) to help calm the bowels down °7) If symptoms become worse call back immediately. ° °

## 2019-02-26 NOTE — Progress Notes (Signed)
Discharge instructions discussed with patient and family, verbalized agreement and understanding 

## 2019-02-26 NOTE — Discharge Summary (Signed)
[facility] Discharge Summary   Patient ID: Edward Ross MRN: [id number] DOB/AGE: [birth year] 49 y.o.  Admit date: 02/22/2019 Discharge date: 02/26/2019  Admitting Diagnosis: Probable right colon perforation post colonoscopy  Discharge Diagnosis Patient Active Problem List   Diagnosis Date Noted  . Perforation of colon as colonoscopy complication (HCC) 02/23/2019  Focal perforation of right mid colon  Consultants Gastroenterology  Imaging: No results found.  Procedures Dr. Ezzard Standing (02/23/19) - Laparoscopic exploration, oversew of perforated right colon, laparoscopic appendectomy  Hospital Course:  Edward Ross is a 48yo male who presented to [facility] 10/26 with worsening right sided abdominal pain after colonoscopy on 02/19/2019.  CT scan showed new free intraperitoneal air. Patient was taken emergently to the operating room for Laparoscopic exploration, oversew of perforated right colon, laparoscopic appendectomy. Tolerated procedure well and was transferred to the floor.  Patient did have an ileus postoperatively but this improved with time and diet was advanced as tolerated.  He was kept on IV antibiotics during admission. On POD3 the patient was tolerating diet, having bowel function, ambulating well, pain well controlled, vital signs stable, incisions c/d/i and felt stable for discharge home. He was discharged home with 3 days of oral antibiotics (cefdinir and flagyl). Patient will follow up as below and knows to call with questions or concerns.     Physical Exam: Gen: Alert, NAD, pleasant HEENT: EOM's intact, pupils equal and round Pulm:Rate andeffort normal WUJ:WJXB, nondistended, +BS, incisions cdi without erythema or drainage, nontender Skin: warm and dry   Allergies as of 02/26/2019      Reactions   Penicillins Rash   Did it involve swelling of the face/tongue/throat, SOB, or low BP? Unknown Did it involve sudden or severe rash/hives, skin peeling, or  any reaction on the inside of your mouth or nose? Unknown Did you need to seek medical attention at a hospital or doctor's office? Unknown When did it last happen? If all above answers are "NO", may proceed with cephalosporin use.      Medication List    STOP taking these medications   ciprofloxacin 500 MG tablet Commonly known as: Cipro   HYDROcodone-acetaminophen 5-325 MG tablet Commonly known as: NORCO/VICODIN   ondansetron 4 MG tablet Commonly known as: ZOFRAN     TAKE these medications   acetaminophen 500 MG tablet Commonly known as: TYLENOL Take 1 tablet (500 mg total) by mouth every 6 (six) hours as needed for mild pain.   cefdinir 300 MG capsule Commonly known as: OMNICEF Take 1 capsule (300 mg total) by mouth 2 (two) times daily for 3 days.   Fish Oil 1200 MG Caps Take 1,200 mg by mouth daily.   Glucosamine-Chondroitin 750 & 400 MG Misc Take 1 tablet by mouth 2 (two) times daily.   metroNIDAZOLE 500 MG tablet Commonly known as: Flagyl Take 1 tablet (500 mg total) by mouth 3 (three) times daily for 3 days.   minoxidil 2 % external solution Commonly known as: ROGAINE Apply 1 mL topically 2 (two) times daily.   multivitamin with minerals Tabs tablet Take 1 tablet by mouth daily.   naproxen 375 MG tablet Commonly known as: NAPROSYN Take 1 tablet (375 mg total) by mouth 2 (two) times daily with a meal. What changed:   when to take this  reasons to take this   omeprazole-sodium bicarbonate 40-1100 MG capsule Commonly known as: ZEGERID Take 1 capsule by mouth daily as needed (for acid reflux).   polyethylene glycol 17 g packet Commonly known  as: MIRALAX / GLYCOLAX Take 17 g by mouth daily. Start taking on: February 27, 2019        Follow-up Information    Alphonsa Overall, MD. Go on 03/17/2019.   Specialty: General Surgery Why: Your appointment is 03/17/2019 at 12pm Please arrive 30 minutes prior to your appointment to check in and fill out  paperwork. Bring photo ID and insurance information. Contact information: [address] [place] [address] [phone]           Signed: Wellington Hampshire, [facility] 02/26/2019, 10:00 AM Please see Amion for pager number during day hours 7:00am-4:30pm

## 2019-05-05 DIAGNOSIS — E7800 Pure hypercholesterolemia, unspecified: Secondary | ICD-10-CM | POA: Diagnosis not present

## 2019-05-05 DIAGNOSIS — R7303 Prediabetes: Secondary | ICD-10-CM | POA: Diagnosis not present

## 2019-05-05 DIAGNOSIS — Z0000 Encounter for general adult medical examination without abnormal findings: Secondary | ICD-10-CM | POA: Diagnosis not present

## 2019-05-05 DIAGNOSIS — M459 Ankylosing spondylitis of unspecified sites in spine: Secondary | ICD-10-CM | POA: Diagnosis not present

## 2019-05-05 DIAGNOSIS — Z125 Encounter for screening for malignant neoplasm of prostate: Secondary | ICD-10-CM | POA: Diagnosis not present

## 2019-05-05 DIAGNOSIS — R030 Elevated blood-pressure reading, without diagnosis of hypertension: Secondary | ICD-10-CM | POA: Diagnosis not present

## 2019-05-05 DIAGNOSIS — K219 Gastro-esophageal reflux disease without esophagitis: Secondary | ICD-10-CM | POA: Diagnosis not present

## 2019-05-10 DIAGNOSIS — Z85820 Personal history of malignant melanoma of skin: Secondary | ICD-10-CM | POA: Diagnosis not present

## 2019-05-10 DIAGNOSIS — Z1283 Encounter for screening for malignant neoplasm of skin: Secondary | ICD-10-CM | POA: Diagnosis not present

## 2019-05-10 DIAGNOSIS — Z08 Encounter for follow-up examination after completed treatment for malignant neoplasm: Secondary | ICD-10-CM | POA: Diagnosis not present

## 2019-08-09 ENCOUNTER — Ambulatory Visit: Payer: BC Managed Care – PPO | Attending: Internal Medicine

## 2019-08-09 DIAGNOSIS — Z23 Encounter for immunization: Secondary | ICD-10-CM

## 2019-08-09 NOTE — Progress Notes (Signed)
   Covid-19 Vaccination Clinic  Name:  Arda Keadle    MRN: [id number] DOB: 01-Jun-1970  08/09/2019  Mr. Bohac was observed post Covid-19 immunization for 15 minutes without incident. He was provided with Vaccine Information Sheet and instruction to access the V-Safe system.   Mr. Harte was instructed to call 911 with any severe reactions post vaccine: Marland Kitchen Difficulty breathing  . Swelling of face and throat  . A fast heartbeat  . A bad rash all over body  . Dizziness and weakness   Immunizations Administered    Name Date Dose VIS Date Route   Pfizer COVID-19 Vaccine 08/09/2019 12:12 PM 0.3 mL 04/09/2019 Intramuscular   Manufacturer: [employer], [employer]   Lot: [id number]   NDC: [address]-3

## 2019-08-30 ENCOUNTER — Ambulatory Visit: Payer: BC Managed Care – PPO | Attending: Internal Medicine

## 2019-08-30 DIAGNOSIS — Z23 Encounter for immunization: Secondary | ICD-10-CM

## 2019-08-30 NOTE — Progress Notes (Signed)
   Covid-19 Vaccination Clinic  Name:  Herron Fero    MRN: [id number] DOB: [birth year]  08/30/2019  Mr. Hurlbut was observed post Covid-19 immunization for 15 minutes without incident. He was provided with Vaccine Information Sheet and instruction to access the V-Safe system.   Mr. Liddy was instructed to call 911 with any severe reactions post vaccine: Marland Kitchen Difficulty breathing  . Swelling of face and throat  . A fast heartbeat  . A bad rash all over body  . Dizziness and weakness   Immunizations Administered    Name Date Dose VIS Date Route   Pfizer COVID-19 Vaccine 08/30/2019  1:52 PM 0.3 mL 06/23/2018 Intramuscular   Manufacturer: [employer], [employer]   Lot: [id number]   NDC: [address]-7

## 2019-11-08 DIAGNOSIS — Z08 Encounter for follow-up examination after completed treatment for malignant neoplasm: Secondary | ICD-10-CM | POA: Diagnosis not present

## 2019-11-08 DIAGNOSIS — Z86006 Personal history of melanoma in-situ: Secondary | ICD-10-CM | POA: Diagnosis not present

## 2019-11-08 DIAGNOSIS — L821 Other seborrheic keratosis: Secondary | ICD-10-CM | POA: Diagnosis not present

## 2019-11-08 DIAGNOSIS — Z1283 Encounter for screening for malignant neoplasm of skin: Secondary | ICD-10-CM | POA: Diagnosis not present

## 2020-05-08 DIAGNOSIS — Z08 Encounter for follow-up examination after completed treatment for malignant neoplasm: Secondary | ICD-10-CM | POA: Diagnosis not present

## 2020-05-08 DIAGNOSIS — L821 Other seborrheic keratosis: Secondary | ICD-10-CM | POA: Diagnosis not present

## 2020-05-08 DIAGNOSIS — Z1283 Encounter for screening for malignant neoplasm of skin: Secondary | ICD-10-CM | POA: Diagnosis not present

## 2020-05-08 DIAGNOSIS — Z86006 Personal history of melanoma in-situ: Secondary | ICD-10-CM | POA: Diagnosis not present

## 2020-05-11 DIAGNOSIS — Z125 Encounter for screening for malignant neoplasm of prostate: Secondary | ICD-10-CM | POA: Diagnosis not present

## 2020-05-11 DIAGNOSIS — Z0000 Encounter for general adult medical examination without abnormal findings: Secondary | ICD-10-CM | POA: Diagnosis not present

## 2020-05-11 DIAGNOSIS — Z23 Encounter for immunization: Secondary | ICD-10-CM | POA: Diagnosis not present

## 2020-05-11 DIAGNOSIS — R030 Elevated blood-pressure reading, without diagnosis of hypertension: Secondary | ICD-10-CM | POA: Diagnosis not present

## 2020-05-11 DIAGNOSIS — G4700 Insomnia, unspecified: Secondary | ICD-10-CM | POA: Diagnosis not present

## 2020-05-11 DIAGNOSIS — R7303 Prediabetes: Secondary | ICD-10-CM | POA: Diagnosis not present

## 2020-05-11 DIAGNOSIS — E7800 Pure hypercholesterolemia, unspecified: Secondary | ICD-10-CM | POA: Diagnosis not present

## 2020-05-15 IMAGING — CT CT ABD-PELV W/ CM
2 of 5 series · 14 of 46 positions shown, 16 images · IV contrast (omnipaque)
Comparison: Same day radiographs, CT abdomen pelvis 02/21/2019

CLINICAL DATA: Bowel perforation, subdiaphragmatic free air

EXAM:
CT ABDOMEN AND PELVIS WITH CONTRAST
TECHNIQUE: Multidetector CT imaging of the abdomen and pelvis was performed
using the standard protocol following bolus administration of
intravenous contrast.
CONTRAST:  100mL OMNIPAQUE IOHEXOL 300 MG/ML  SOLN

[Series 2: axial st · axial · 0.77mm/px · z∈[-824,-369]mm · 11 of 105 slices shown, 13 images]
[im 7/105  soft-tissue]
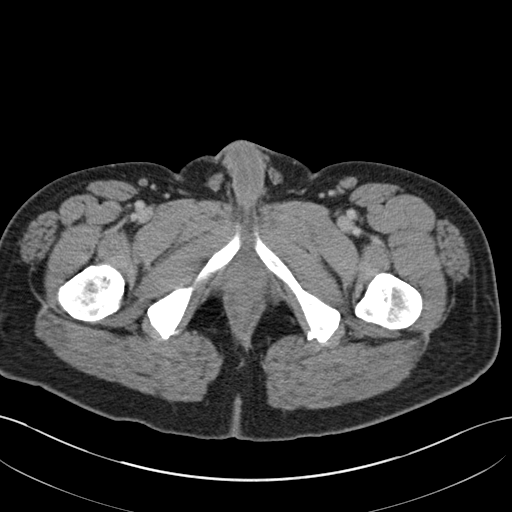
[im 7/105  bone]
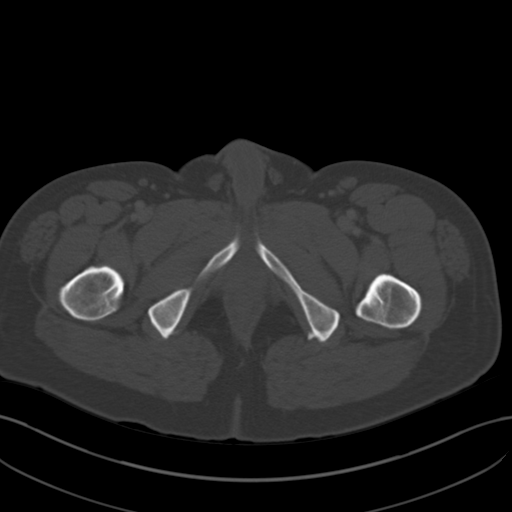
[im 20/105  soft-tissue]
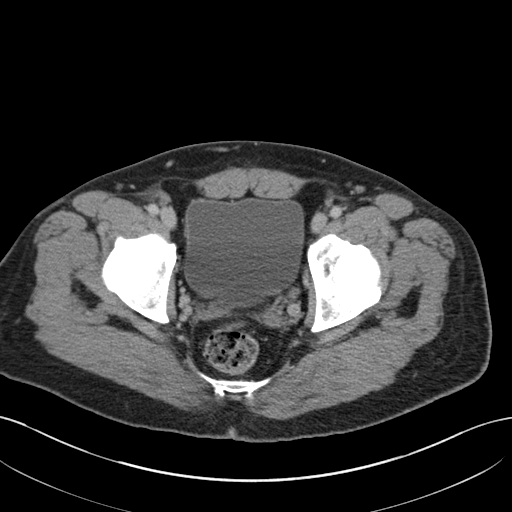
[im 27/105  soft-tissue]
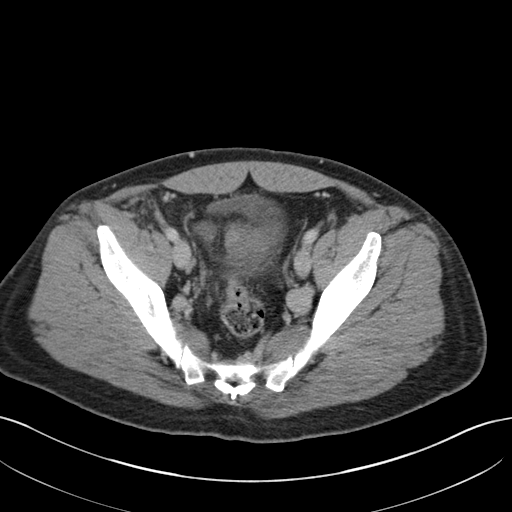
[im 33/105  soft-tissue]
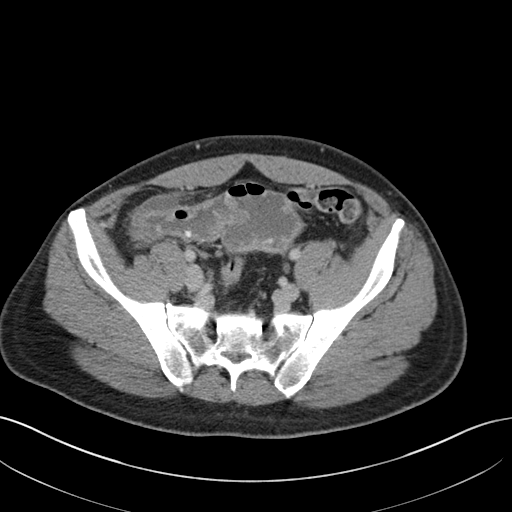
[im 46/105  soft-tissue]
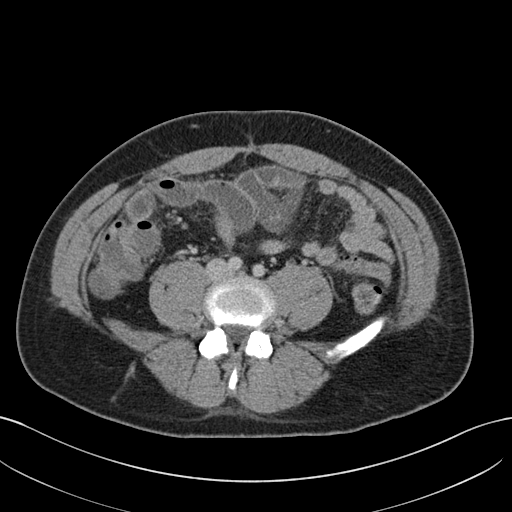
[im 53/105  soft-tissue]
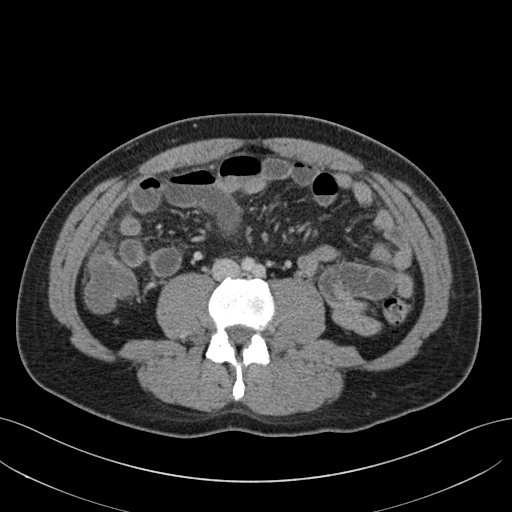
[im 59/105  soft-tissue]
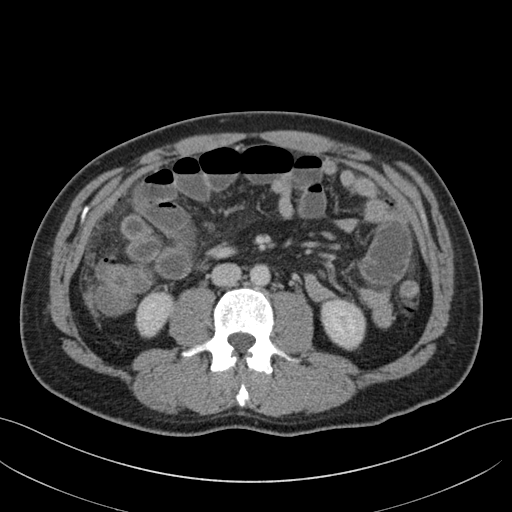
[im 72/105  soft-tissue]
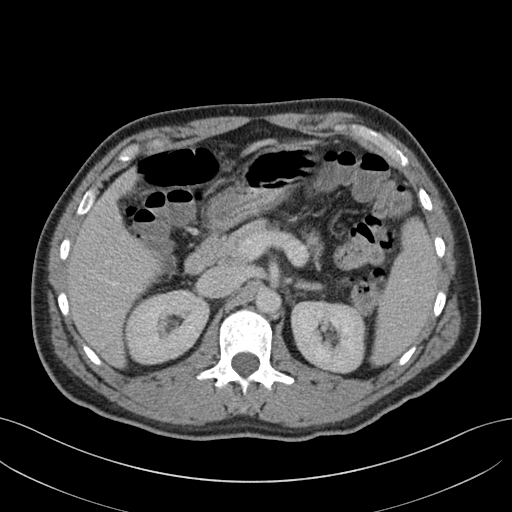
[im 79/105  soft-tissue]
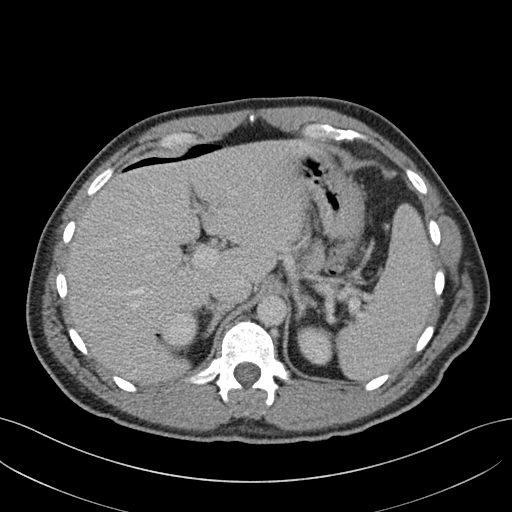
[im 79/105  bone]
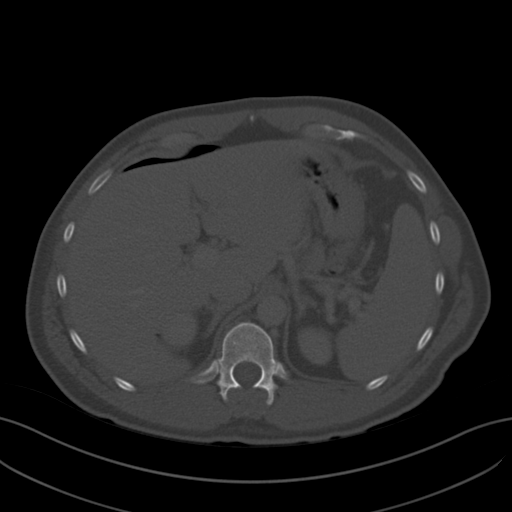
[im 85/105  soft-tissue]
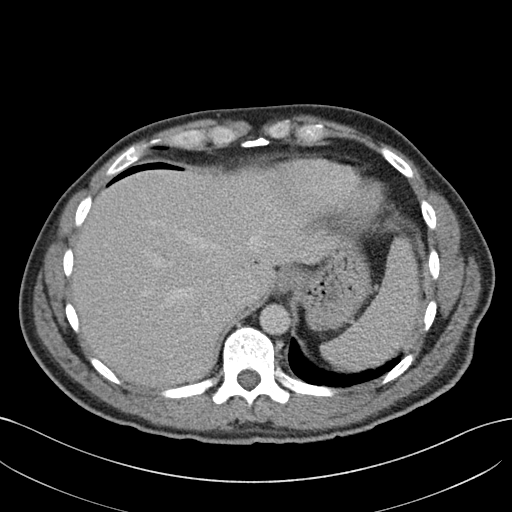
[im 98/105  soft-tissue]
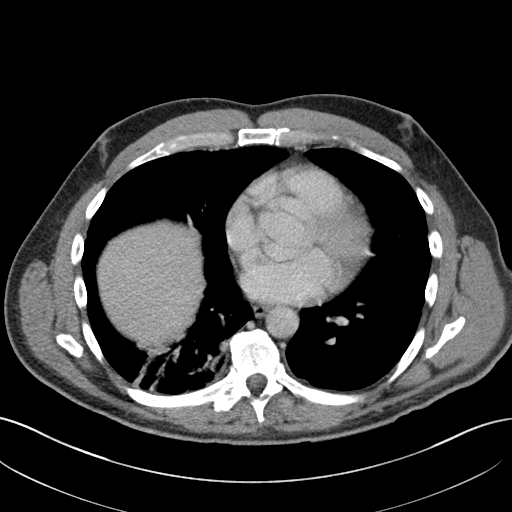

[Series 5: coronal st · coronal · 0.73mm/px · 3 of 136 slices shown]
[im 46/136  soft-tissue]
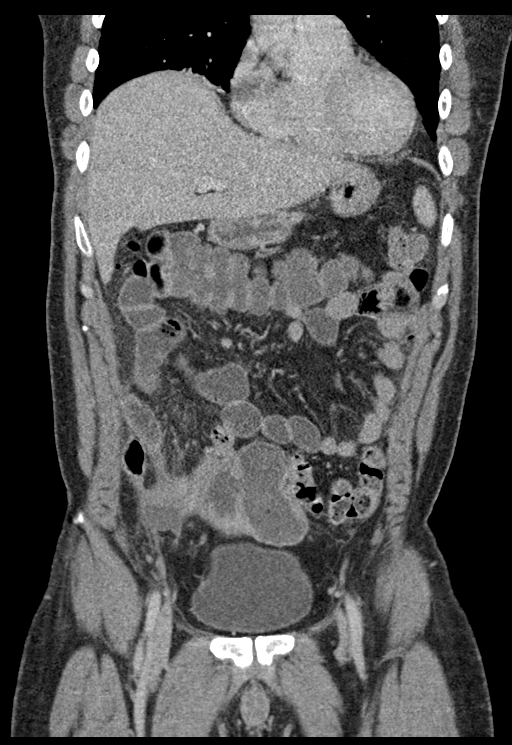
[im 61/136  soft-tissue]
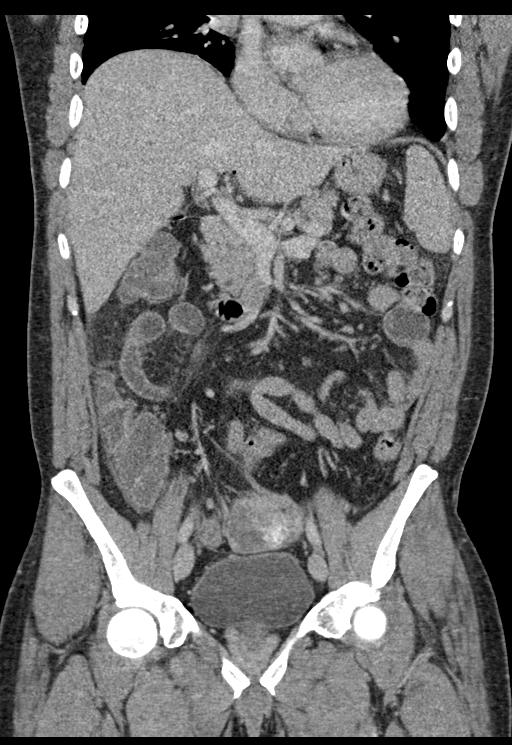
[im 76/136  soft-tissue]
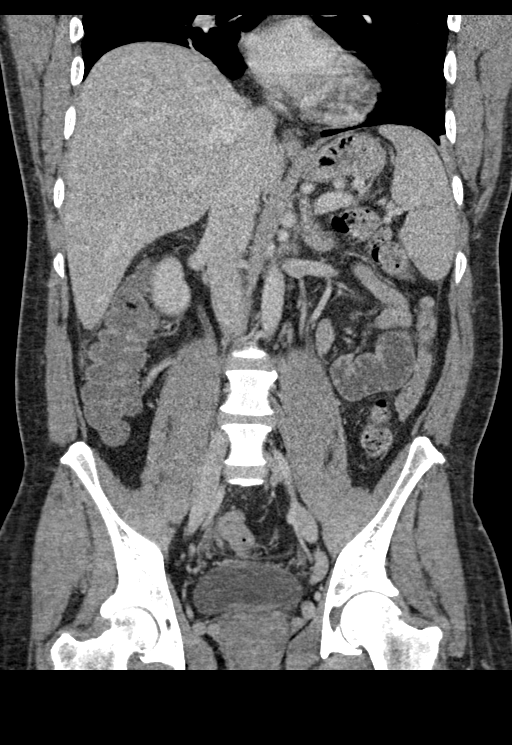

[14 of 46 positions shown; findings below may reference images not displayed]

FINDINGS: Lower chest: Basilar bandlike atelectasis and/or scarring. Lung
bases are otherwise clear. Calcified granuloma in the right middle
lobe. Normal heart size. No pericardial effusion.

Hepatobiliary: No focal liver abnormality is seen. Patient is post
cholecystectomy. No biliary ductal dilatation or calcified
intraductal gallstones.

Pancreas: Unremarkable. No pancreatic ductal dilatation or
surrounding inflammatory changes.

Spleen: Normal in size without focal abnormality.

Adrenals/Urinary Tract: Normal adrenal glands. Stable 1 cm fluid
attenuation cyst in the upper pole right kidney. Small linear area
of cortical scarring in the left kidney. No suspicious renal
lesions. No urolithiasis or hydronephrosis. Urinary bladder is
unremarkable.

Stomach/Bowel: Distal esophagus cm a more normal appearance on this
exam. The stomach is unremarkable. Duodenal sweep is free of
abnormality. Multiple air and fluid-filled loops of small bowel
comprise much of the ileum. There is increasing distal ileal mural
thickening and adjacent stranding and phlegmonous change
predominantly in the right lower quadrant. A small focus of
extraluminal gas is seen adjacent the wall of the terminal ileum
(2/71). Few distal ileal diverticula are noted (2/64). There is
extensive mural thickening and hyperemia at the level of the cecum
which appears displaced into the midline pelvis. Beyond the hepatic
flexure, the colon assumes a more normal appearance.

Vascular/Lymphatic: The aorta is normal caliber. Scattered reactive
mid abdominal and right lower quadrant mesenteric lymph nodes. No
pathologically enlarged nodes.

Reproductive: The prostate and seminal vesicles are unremarkable.

Other: New free intraperitoneal air seen layering anterior to the
liver. Increasing phlegmonous change in the anterior mesentery with
some enhancement and thickening of the right lower quadrant anterior
peritoneal surface compatible with peritonitis. Small volume of
intermediate attenuation (20 HU) fluid in the right lower quadrant
suspicious for succus.

Musculoskeletal: Multilevel degenerative changes are present in the
imaged portions of the spine. No acute osseous abnormality or
suspicious osseous lesion.
IMPRESSION: 1. New free intraperitoneal air layering anterior to the liver,
compatible with a perforated viscus. Small volume of intermediate
attenuation fluid in the right lower quadrant suspicious for succus.
2. Worsening distal ileal mural thickening and hyperemia with
adjacent phlegmonous change. Small focus of extraluminal gas
adjacent the wall of the terminal ileum, may suggest site of
possible perforation.
3. Borderline air and fluid distention of the distal small bowel may
reflect a developing ileus, less likely obstruction in the absence
of discrete transition point.
4. Increased reactive thickening of the cecum is noted as well.
5. Thickening of the right lower quadrant anterior peritoneal
surface compatible with peritonitis.

## 2020-06-08 DIAGNOSIS — H25013 Cortical age-related cataract, bilateral: Secondary | ICD-10-CM | POA: Diagnosis not present

## 2020-06-08 DIAGNOSIS — H2513 Age-related nuclear cataract, bilateral: Secondary | ICD-10-CM | POA: Diagnosis not present

## 2020-06-08 DIAGNOSIS — H40013 Open angle with borderline findings, low risk, bilateral: Secondary | ICD-10-CM | POA: Diagnosis not present

## 2020-06-08 DIAGNOSIS — H35411 Lattice degeneration of retina, right eye: Secondary | ICD-10-CM | POA: Diagnosis not present

## 2020-11-14 DIAGNOSIS — D225 Melanocytic nevi of trunk: Secondary | ICD-10-CM | POA: Diagnosis not present

## 2020-11-14 DIAGNOSIS — Z85820 Personal history of malignant melanoma of skin: Secondary | ICD-10-CM | POA: Diagnosis not present

## 2020-11-14 DIAGNOSIS — Z1283 Encounter for screening for malignant neoplasm of skin: Secondary | ICD-10-CM | POA: Diagnosis not present

## 2020-11-14 DIAGNOSIS — Z08 Encounter for follow-up examination after completed treatment for malignant neoplasm: Secondary | ICD-10-CM | POA: Diagnosis not present

## 2021-05-15 DIAGNOSIS — Z1283 Encounter for screening for malignant neoplasm of skin: Secondary | ICD-10-CM | POA: Diagnosis not present

## 2021-05-15 DIAGNOSIS — Z85820 Personal history of malignant melanoma of skin: Secondary | ICD-10-CM | POA: Diagnosis not present

## 2021-05-15 DIAGNOSIS — Z08 Encounter for follow-up examination after completed treatment for malignant neoplasm: Secondary | ICD-10-CM | POA: Diagnosis not present

## 2021-05-15 DIAGNOSIS — D225 Melanocytic nevi of trunk: Secondary | ICD-10-CM | POA: Diagnosis not present

## 2021-06-26 DIAGNOSIS — R030 Elevated blood-pressure reading, without diagnosis of hypertension: Secondary | ICD-10-CM | POA: Diagnosis not present

## 2021-06-26 DIAGNOSIS — R7303 Prediabetes: Secondary | ICD-10-CM | POA: Diagnosis not present

## 2021-06-26 DIAGNOSIS — E7800 Pure hypercholesterolemia, unspecified: Secondary | ICD-10-CM | POA: Diagnosis not present

## 2021-06-26 DIAGNOSIS — Z0000 Encounter for general adult medical examination without abnormal findings: Secondary | ICD-10-CM | POA: Diagnosis not present

## 2021-06-26 DIAGNOSIS — Z125 Encounter for screening for malignant neoplasm of prostate: Secondary | ICD-10-CM | POA: Diagnosis not present

## 2021-06-26 DIAGNOSIS — G4700 Insomnia, unspecified: Secondary | ICD-10-CM | POA: Diagnosis not present

## 2023-07-28 ENCOUNTER — Other Ambulatory Visit (HOSPITAL_BASED_OUTPATIENT_CLINIC_OR_DEPARTMENT_OTHER): Payer: Self-pay | Admitting: Family Medicine

## 2023-07-28 DIAGNOSIS — Z136 Encounter for screening for cardiovascular disorders: Secondary | ICD-10-CM

## 2023-08-28 ENCOUNTER — Ambulatory Visit (HOSPITAL_BASED_OUTPATIENT_CLINIC_OR_DEPARTMENT_OTHER)
Admission: RE | Admit: 2023-08-28 | Discharge: 2023-08-28 | Disposition: A | Discharge status: Home / Self Care | Payer: Self-pay | Source: Ambulatory Visit | Attending: Family Medicine | Admitting: Family Medicine

## 2023-08-28 DIAGNOSIS — Z136 Encounter for screening for cardiovascular disorders: Secondary | ICD-10-CM | POA: Insufficient documentation
